# Patient Record
Sex: Female | Born: 1970 | Hispanic: Yes | Marital: Married | Smoking: Never smoker
Health system: Southern US, Community
[De-identification: ages and names within clinical notes are randomized; demographics above are authoritative.]

## PROBLEM LIST (undated history)

## (undated) ENCOUNTER — Inpatient Hospital Stay (HOSPITAL_COMMUNITY): Payer: Self-pay

## (undated) DIAGNOSIS — O24419 Gestational diabetes mellitus in pregnancy, unspecified control: Principal | ICD-10-CM

## (undated) HISTORY — DX: Gestational diabetes mellitus in pregnancy, unspecified control: O24.419

---

## 2006-04-16 ENCOUNTER — Other Ambulatory Visit: Admission: RE | Admit: 2006-04-16 | Discharge: 2006-04-16 | Payer: Self-pay | Admitting: Obstetrics and Gynecology

## 2006-04-16 ENCOUNTER — Encounter (INDEPENDENT_AMBULATORY_CARE_PROVIDER_SITE_OTHER): Payer: Self-pay | Admitting: Specialist

## 2006-04-16 ENCOUNTER — Ambulatory Visit: Payer: Self-pay | Admitting: Obstetrics & Gynecology

## 2007-10-09 ENCOUNTER — Emergency Department (HOSPITAL_COMMUNITY): Admission: EM | Admit: 2007-10-09 | Discharge: 2007-10-10 | Payer: Self-pay | Admitting: Emergency Medicine

## 2007-11-08 ENCOUNTER — Emergency Department (HOSPITAL_COMMUNITY): Admission: EM | Admit: 2007-11-08 | Discharge: 2007-11-08 | Payer: Self-pay | Admitting: Emergency Medicine

## 2007-12-24 ENCOUNTER — Emergency Department (HOSPITAL_COMMUNITY): Admission: EM | Admit: 2007-12-24 | Discharge: 2007-12-25 | Payer: Self-pay | Admitting: Emergency Medicine

## 2008-11-10 ENCOUNTER — Emergency Department (HOSPITAL_COMMUNITY): Admission: EM | Admit: 2008-11-10 | Discharge: 2008-11-11 | Payer: Self-pay | Admitting: Emergency Medicine

## 2010-02-06 ENCOUNTER — Inpatient Hospital Stay (HOSPITAL_COMMUNITY)
Admission: RE | Admit: 2010-02-06 | Discharge: 2010-02-08 | Payer: Self-pay | Source: Home / Self Care | Admitting: Obstetrics

## 2010-03-01 ENCOUNTER — Emergency Department (HOSPITAL_COMMUNITY): Admission: EM | Admit: 2010-03-01 | Discharge: 2009-12-01 | Payer: Self-pay | Admitting: Emergency Medicine

## 2010-06-05 LAB — CBC
HCT: 29.9 % — ABNORMAL LOW (ref 36.0–46.0)
HCT: 33.2 % — ABNORMAL LOW (ref 36.0–46.0)
MCH: 33.8 pg (ref 26.0–34.0)
MCH: 34.7 pg — ABNORMAL HIGH (ref 26.0–34.0)
MCV: 100.5 fL — ABNORMAL HIGH (ref 78.0–100.0)
MCV: 100.8 fL — ABNORMAL HIGH (ref 78.0–100.0)
Platelets: 202 10*3/uL (ref 150–400)
Platelets: 220 10*3/uL (ref 150–400)
RBC: 2.98 MIL/uL — ABNORMAL LOW (ref 3.87–5.11)
RBC: 3.3 MIL/uL — ABNORMAL LOW (ref 3.87–5.11)
RDW: 14 % (ref 11.5–15.5)

## 2010-06-07 LAB — URINALYSIS, ROUTINE W REFLEX MICROSCOPIC
Bilirubin Urine: NEGATIVE
Glucose, UA: NEGATIVE mg/dL
Hgb urine dipstick: NEGATIVE
Ketones, ur: NEGATIVE mg/dL
Nitrite: NEGATIVE
Specific Gravity, Urine: 1.031 — ABNORMAL HIGH (ref 1.005–1.030)
pH: 5.5 (ref 5.0–8.0)

## 2010-06-07 LAB — URINE CULTURE: Culture  Setup Time: 201109090836

## 2010-06-07 LAB — URINE MICROSCOPIC-ADD ON

## 2010-06-30 LAB — POCT I-STAT, CHEM 8
Calcium, Ion: 1.17 mmol/L (ref 1.12–1.32)
Creatinine, Ser: 0.7 mg/dL (ref 0.4–1.2)
Glucose, Bld: 87 mg/dL (ref 70–99)
HCT: 36 % (ref 36.0–46.0)
Hemoglobin: 12.2 g/dL (ref 12.0–15.0)
Potassium: 3.8 mEq/L (ref 3.5–5.1)

## 2010-06-30 LAB — URINE MICROSCOPIC-ADD ON

## 2010-06-30 LAB — WET PREP, GENITAL: Trich, Wet Prep: NONE SEEN

## 2010-06-30 LAB — CBC
HCT: 35.1 % — ABNORMAL LOW (ref 36.0–46.0)
Hemoglobin: 12.3 g/dL (ref 12.0–15.0)
MCHC: 34.9 g/dL (ref 30.0–36.0)
MCV: 99.6 fL (ref 78.0–100.0)
RBC: 3.53 MIL/uL — ABNORMAL LOW (ref 3.87–5.11)
RDW: 13.7 % (ref 11.5–15.5)

## 2010-06-30 LAB — GC/CHLAMYDIA PROBE AMP, GENITAL
Chlamydia, DNA Probe: NEGATIVE
GC Probe Amp, Genital: NEGATIVE

## 2010-06-30 LAB — URINE CULTURE: Colony Count: >=100000

## 2010-06-30 LAB — URINALYSIS, ROUTINE W REFLEX MICROSCOPIC
Bilirubin Urine: NEGATIVE
Glucose, UA: NEGATIVE mg/dL
Hgb urine dipstick: NEGATIVE
Protein, ur: NEGATIVE mg/dL
Urobilinogen, UA: 2 mg/dL — ABNORMAL HIGH (ref 0.0–1.0)

## 2010-07-03 ENCOUNTER — Emergency Department (HOSPITAL_COMMUNITY)
Admission: EM | Admit: 2010-07-03 | Discharge: 2010-07-04 | Disposition: A | Discharge status: Home / Self Care | Payer: BC Managed Care – HMO | Attending: Emergency Medicine | Admitting: Emergency Medicine

## 2010-07-03 DIAGNOSIS — IMO0001 Reserved for inherently not codable concepts without codable children: Secondary | ICD-10-CM | POA: Insufficient documentation

## 2010-07-03 DIAGNOSIS — R197 Diarrhea, unspecified: Secondary | ICD-10-CM | POA: Insufficient documentation

## 2010-07-03 DIAGNOSIS — N390 Urinary tract infection, site not specified: Secondary | ICD-10-CM | POA: Insufficient documentation

## 2010-07-03 DIAGNOSIS — R300 Dysuria: Secondary | ICD-10-CM | POA: Insufficient documentation

## 2010-07-03 DIAGNOSIS — K8020 Calculus of gallbladder without cholecystitis without obstruction: Secondary | ICD-10-CM | POA: Insufficient documentation

## 2010-07-03 DIAGNOSIS — R109 Unspecified abdominal pain: Secondary | ICD-10-CM | POA: Insufficient documentation

## 2010-07-03 DIAGNOSIS — R51 Headache: Secondary | ICD-10-CM | POA: Insufficient documentation

## 2010-07-03 DIAGNOSIS — R112 Nausea with vomiting, unspecified: Secondary | ICD-10-CM | POA: Insufficient documentation

## 2010-07-03 LAB — URINALYSIS, ROUTINE W REFLEX MICROSCOPIC
Glucose, UA: NEGATIVE mg/dL
Hgb urine dipstick: NEGATIVE
Ketones, ur: NEGATIVE mg/dL
Protein, ur: NEGATIVE mg/dL
Urobilinogen, UA: 0.2 mg/dL (ref 0.0–1.0)

## 2010-07-03 LAB — URINE MICROSCOPIC-ADD ON

## 2010-07-04 ENCOUNTER — Emergency Department (HOSPITAL_COMMUNITY): Payer: BC Managed Care – HMO

## 2010-07-04 LAB — POCT I-STAT, CHEM 8
BUN: 11 mg/dL (ref 6–23)
Chloride: 109 mEq/L (ref 96–112)
Creatinine, Ser: 0.7 mg/dL (ref 0.4–1.2)
Potassium: 4 mEq/L (ref 3.5–5.1)
Sodium: 139 mEq/L (ref 135–145)
TCO2: 18 mmol/L (ref 0–100)

## 2010-07-04 LAB — DIFFERENTIAL
Eosinophils Relative: 2 % (ref 0–5)
Lymphocytes Relative: 19 % (ref 12–46)
Lymphs Abs: 1.6 10*3/uL (ref 0.7–4.0)
Monocytes Relative: 3 % (ref 3–12)

## 2010-07-04 LAB — LIPASE, BLOOD: Lipase: 27 U/L (ref 11–59)

## 2010-07-04 LAB — HEPATIC FUNCTION PANEL
ALT: 30 U/L (ref 0–35)
AST: 28 U/L (ref 0–37)
Albumin: 3.8 g/dL (ref 3.5–5.2)
Alkaline Phosphatase: 89 U/L (ref 39–117)
Total Protein: 7.5 g/dL (ref 6.0–8.3)

## 2010-07-04 LAB — CBC
MCH: 33.5 pg (ref 26.0–34.0)
MCHC: 35.1 g/dL (ref 30.0–36.0)
MCV: 95.4 fL (ref 78.0–100.0)
Platelets: 262 10*3/uL (ref 150–400)

## 2010-07-05 LAB — URINE CULTURE: Colony Count: 9000

## 2010-12-25 LAB — URINE MICROSCOPIC-ADD ON

## 2010-12-25 LAB — URINALYSIS, ROUTINE W REFLEX MICROSCOPIC
Glucose, UA: NEGATIVE
Ketones, ur: NEGATIVE
Nitrite: POSITIVE — AB
Protein, ur: 30 — AB
pH: 6

## 2010-12-25 LAB — POCT PREGNANCY, URINE: Preg Test, Ur: POSITIVE

## 2010-12-25 LAB — WET PREP, GENITAL: Clue Cells Wet Prep HPF POC: NONE SEEN

## 2010-12-25 LAB — GC/CHLAMYDIA PROBE AMP, GENITAL: Chlamydia, DNA Probe: NEGATIVE

## 2011-06-27 ENCOUNTER — Other Ambulatory Visit: Payer: Self-pay

## 2011-06-27 DIAGNOSIS — Z331 Pregnant state, incidental: Secondary | ICD-10-CM

## 2011-06-27 NOTE — Progress Notes (Signed)
Prenatal labs done today Oden Lindaman 

## 2011-06-28 LAB — OBSTETRIC PANEL
Basophils Absolute: 0 10*3/uL (ref 0.0–0.1)
Hepatitis B Surface Ag: NEGATIVE
Lymphocytes Relative: 27 % (ref 12–46)
Lymphs Abs: 2.3 10*3/uL (ref 0.7–4.0)
Neutro Abs: 5.5 10*3/uL (ref 1.7–7.7)
Neutrophils Relative %: 67 % (ref 43–77)
Platelets: 299 10*3/uL (ref 150–400)
RBC: 3.76 MIL/uL — ABNORMAL LOW (ref 3.87–5.11)
RDW: 13.6 % (ref 11.5–15.5)
Rubella: 26.5 IU/mL — ABNORMAL HIGH
WBC: 8.3 10*3/uL (ref 4.0–10.5)

## 2011-06-28 LAB — HIV ANTIBODY (ROUTINE TESTING W REFLEX): HIV: NONREACTIVE

## 2011-06-30 LAB — CULTURE, OB URINE: Colony Count: 35000

## 2011-07-04 ENCOUNTER — Ambulatory Visit (INDEPENDENT_AMBULATORY_CARE_PROVIDER_SITE_OTHER): Payer: Self-pay | Admitting: Family Medicine

## 2011-07-04 ENCOUNTER — Encounter: Payer: Self-pay | Admitting: Family Medicine

## 2011-07-04 DIAGNOSIS — Z3480 Encounter for supervision of other normal pregnancy, unspecified trimester: Secondary | ICD-10-CM

## 2011-07-04 DIAGNOSIS — R8271 Bacteriuria: Secondary | ICD-10-CM | POA: Insufficient documentation

## 2011-07-04 DIAGNOSIS — E669 Obesity, unspecified: Secondary | ICD-10-CM

## 2011-07-04 DIAGNOSIS — O99891 Other specified diseases and conditions complicating pregnancy: Secondary | ICD-10-CM

## 2011-07-04 MED ORDER — NITROFURANTOIN MONOHYD MACRO 100 MG PO CAPS: 100.0000 mg | ORAL_CAPSULE | Freq: Two times a day (BID) | ORAL | Status: AC

## 2011-07-04 MED ORDER — PRENATAL VITAMINS (DIS) PO TABS: 1.0000 | ORAL_TABLET | Freq: Every day | ORAL | Status: DC

## 2011-07-04 NOTE — Progress Notes (Signed)
  Subjective:    Christie Holder is a G2P0010 [redacted]w[redacted]d being seen today for her first obstetrical visit.  Her obstetrical history is significant for advanced maternal age, obesity and 4 week miscarriage in 2009.Marland Kitchen Patient does intend to breast feed. Pregnancy history fully reviewed.  Patient reports Fatigue, but nausea has improved.Ceasar Mons Vitals:   07/04/11 1429 07/04/11 1429  BP: 122/66   Temp: 98.5 F (36.9 C)   Height:  5' 2.25" (1.581 m)  Weight: 236 lb (107.049 kg)     HISTORY: OB History    Grav Para Term Preterm Abortions TAB SAB Ect Mult Living   2    1  1         # Outc Date GA Lbr Len/2nd Wgt Sex Del Anes PTL Lv   1 SAB            2 CUR              History reviewed. No pertinent past medical history. History reviewed. No pertinent past surgical history. Family History  Problem Relation Age of Onset  . Diabetes Maternal Grandmother      Exam    Uterus:   Gravid, fundal height 15 cm  Pelvic Exam:    Perineum: No Hemorrhoids   Vulva: normal   Vagina:  normal mucosa, normal discharge       Cervix: Appears normal   Adnexa: normal adnexa      System:         Neurologic: oriented, normal mood   Extremities: No edema   HEENT PERRLA and extra ocular movement intact   Mouth/Teeth mucous membranes moist, pharynx normal without lesions   Neck supple   Cardiovascular: regular rate and rhythm   Respiratory:  appears well, vitals normal, no respiratory distress, acyanotic, normal RR, ear and throat exam is normal, neck free of mass or lymphadenopathy, chest clear, no wheezing, crepitations, rhonchi, normal symmetric air entry   Abdomen: soft, non-tender; bowel sounds normal; no masses,  no organomegaly          Assessment:    Pregnancy: G2P0010 Patient Active Problem List  Diagnoses  . Asymptomatic bacteriuria in pregnancy  . Pregnancy examination or test        Plan:     Initial labs reviewed. Patient with 35,000 colonies of E. Coli, will  treat with Macrobid for 7 days for asymptomatic bacteruria. Repeat urine culture in one week.  Patient desires Quad screen, will have her come in in one week to have it drawn. Will also have patient do early glucola in one week due to BMI >30  Prenatal vitamins. Problem list reviewed and updated.  Ultrasound discussed; Patient is applying for orange card, will order anatomy scan next visit.  Bleeding precautions.   Follow up in 4 weeks.    Lynnda Wiersma 07/04/2011

## 2011-07-04 NOTE — Patient Instructions (Signed)
Please come in for a lab visit in one week.  We will draw the genetics test, test your blood sugar, and repeat your urine test to make sure the bacteria is cone.   Please come back in about 4 weeks for your next check up.

## 2011-07-15 ENCOUNTER — Other Ambulatory Visit: Payer: Self-pay

## 2011-07-15 DIAGNOSIS — E669 Obesity, unspecified: Secondary | ICD-10-CM

## 2011-07-15 LAB — GLUCOSE, CAPILLARY
Comment 1: 1
Glucose-Capillary: 133 mg/dL — ABNORMAL HIGH (ref 70–99)

## 2011-07-15 NOTE — Progress Notes (Signed)
Note reviewed.  Agree with Dr. Melina Modena plan.   AMA- pt desires quad screen.  Pt also could meet with genetics if interested in discussing risk and testing options, such as amnio. Obesity- pt here today to get early 1 hour glucola.  Will also need rescreening at 24-28 weeks if passes today's test.

## 2011-07-15 NOTE — Progress Notes (Unsigned)
1 hr Glucose = 133 mg/dL Franne Forts screen [place], MLS

## 2011-07-17 LAB — CULTURE, OB URINE: Colony Count: 6000

## 2011-08-08 ENCOUNTER — Ambulatory Visit (INDEPENDENT_AMBULATORY_CARE_PROVIDER_SITE_OTHER): Payer: Self-pay | Admitting: Family Medicine

## 2011-08-08 VITALS — BP 100/62 | Temp 97.8°F | Wt 235.0 lb

## 2011-08-08 DIAGNOSIS — O09529 Supervision of elderly multigravida, unspecified trimester: Secondary | ICD-10-CM

## 2011-08-08 DIAGNOSIS — Z3400 Encounter for supervision of normal first pregnancy, unspecified trimester: Secondary | ICD-10-CM

## 2011-08-08 DIAGNOSIS — Z3490 Encounter for supervision of normal pregnancy, unspecified, unspecified trimester: Secondary | ICD-10-CM

## 2011-08-08 DIAGNOSIS — Z3402 Encounter for supervision of normal first pregnancy, second trimester: Secondary | ICD-10-CM

## 2011-08-08 NOTE — Patient Instructions (Signed)
It was nice to see you today. I am sorry you have a bad cold.  You should feel better in a few days.  Try to drink plenty of fluids, especially water, and rest when you can.  You can try some plain Robitussin. (Robitussin DM), but it won't help much.   If you have bad pain or any vaginal bleeding, please let us know. We will set up an ultrasound with Dr. Elsie Stain office. Please come back and see [place] in 2 weeks (Dr. Ashley Royalty) Fue agradable verte hoy. Lo Siento que tiene un resfriado. Usted debe sentirse mejor en unos [employer]. Trate de beber muchos lquidos, especialmente agua, y el resto cuando pueda. Puede probar algunos Robitussin normal. (Robitussin DM), pero no va a ayudar mucho.Si usted tiene [facility]bal[facility] severo o el sangrado vaginal, por favor hganoslo saber.Vamos a establecer una ecografa con la oficina del Dr. Gaynell Face.Por favor, vuelva a vernos en 2 semanas (Dr. Ashley Royalty)

## 2011-08-08 NOTE — Progress Notes (Signed)
Has a cold.  Told at urgent care that she had the flu and was put on amoxicillin and decongestant.  + cough.  No fever currently. Takes tylenol, meds from urgent care. PE: Obese.  No distress. OP clear. Lungs clear. See flow sheet for details. A/P: Pregnancy - going well.  Will schedule ultrasound with Dr. Elsie Stain office. Obesity: Early 1 hour 133.  Pt has lost 1 lb due to current URI.  Will need routine glucola 26-28 weeks. AMA: normal quad screen. Asymptomatic Bacteruria - TOC negative.

## 2011-08-12 ENCOUNTER — Ambulatory Visit: Payer: Self-pay | Admitting: Family Medicine

## 2011-08-13 ENCOUNTER — Telehealth: Payer: Self-pay | Admitting: Family Medicine

## 2011-08-13 NOTE — Telephone Encounter (Signed)
I called pt and LVM hopelly pt will call us back.  Marines

## 2011-08-22 ENCOUNTER — Encounter: Payer: Self-pay | Admitting: Family Medicine

## 2011-09-02 ENCOUNTER — Encounter: Payer: Self-pay | Admitting: Family Medicine

## 2011-09-10 ENCOUNTER — Ambulatory Visit (INDEPENDENT_AMBULATORY_CARE_PROVIDER_SITE_OTHER): Payer: Self-pay | Admitting: Family Medicine

## 2011-09-10 DIAGNOSIS — Z3400 Encounter for supervision of normal first pregnancy, unspecified trimester: Secondary | ICD-10-CM

## 2011-09-10 NOTE — Progress Notes (Signed)
S: Here today for routine prenatal follow-up. Did not get anatomy ultrasound due to financial limitations.  Plans to get it in the next several weeks.  Does not desire amniocentesis.  Has gained a large amount of weight due to increased appetite.  Declines 1 hr glucola today. A/P:  41 yo G7P6 @ 24 + 5 weeks.  Rescheduled anatomy ultrasound for her.  She declines 1 hr glucola today.  WIll see her back in 2 weeks for follow-up and to obtain 26-28 wk labs.

## 2011-09-10 NOTE — Patient Instructions (Addendum)
Come back in 2 weeks to see Dr Louanne Belton or [facility]  Will get your 1 hr glucola test at that time  Get ultrasound at Dr. Elsie Stain office.  Work on [employer], weight gain

## 2011-09-30 ENCOUNTER — Ambulatory Visit (INDEPENDENT_AMBULATORY_CARE_PROVIDER_SITE_OTHER): Payer: Self-pay | Admitting: Family Medicine

## 2011-09-30 VITALS — BP 115/74 | Temp 98.4°F | Wt 252.0 lb

## 2011-09-30 DIAGNOSIS — Z3480 Encounter for supervision of other normal pregnancy, unspecified trimester: Secondary | ICD-10-CM

## 2011-09-30 DIAGNOSIS — O0990 Supervision of high risk pregnancy, unspecified, unspecified trimester: Secondary | ICD-10-CM | POA: Insufficient documentation

## 2011-09-30 DIAGNOSIS — Z3490 Encounter for supervision of normal pregnancy, unspecified, unspecified trimester: Secondary | ICD-10-CM

## 2011-09-30 LAB — HIV ANTIBODY (ROUTINE TESTING W REFLEX): HIV: NONREACTIVE

## 2011-09-30 LAB — CBC
HCT: 34.6 % — ABNORMAL LOW (ref 36.0–46.0)
Hemoglobin: 11.7 g/dL — ABNORMAL LOW (ref 12.0–15.0)
MCH: 32.8 pg (ref 26.0–34.0)
RBC: 3.57 MIL/uL — ABNORMAL LOW (ref 3.87–5.11)

## 2011-09-30 NOTE — Patient Instructions (Signed)
Please be make an appointment at the front desk to have your blood sugar test done.  This will take one hour, and needs to be done this week.   Please make an appointment to see Dr. Louanne Belton in two weeks.    Tenga en hacer una cita en la recepcin para tener su prueba de azcar en la sangre hace. Esto tomar [employer], y que hay que hacer esta semana.  Por favor, haga una cita para ver al doctor [employer].

## 2011-09-30 NOTE — Progress Notes (Signed)
41 year old [id number] @ [redacted]w[redacted]d who presents for prenatal visit.  She says she is feeling the baby move, denies contractions, vaginal bleeding, vaginal discharge.  She has an appointment for her ultrasound at Dr. Elsie Stain office on Friday.  The patient has refused an early Glucola, and today is due for one, but she says she does not have time today.   - Will draw CBC, HIV, and RPR today.  - Patient instructed to schedule lab visit for Glucola, stressed importance of this being done this week.  - Labor Precautions reviewed.  - Follow up with Dr. Louanne Belton in 2 weeks.

## 2011-10-02 ENCOUNTER — Encounter: Payer: Self-pay | Admitting: Family Medicine

## 2011-10-03 ENCOUNTER — Other Ambulatory Visit: Payer: Self-pay

## 2011-10-03 LAB — GLUCOSE, CAPILLARY
Comment 1: 1
Glucose-Capillary: 141 mg/dL — ABNORMAL HIGH (ref 70–99)

## 2011-10-03 NOTE — Progress Notes (Signed)
1 hr done today, 3 hr scheduled for 10/09/11.Busick, Rodena Medin

## 2011-10-09 ENCOUNTER — Other Ambulatory Visit: Payer: Self-pay

## 2011-10-09 DIAGNOSIS — Z3490 Encounter for supervision of normal pregnancy, unspecified, unspecified trimester: Secondary | ICD-10-CM

## 2011-10-09 LAB — GLUCOSE, CAPILLARY

## 2011-10-09 NOTE — Progress Notes (Signed)
3 HR GTT DONE TODAY Christie Holder 

## 2011-10-10 ENCOUNTER — Encounter: Payer: Self-pay | Admitting: Family Medicine

## 2011-10-10 ENCOUNTER — Telehealth: Payer: Self-pay | Admitting: Family Medicine

## 2011-10-10 DIAGNOSIS — O24419 Gestational diabetes mellitus in pregnancy, unspecified control: Secondary | ICD-10-CM | POA: Insufficient documentation

## 2011-10-10 HISTORY — DX: Gestational diabetes mellitus in pregnancy, unspecified control: O24.419

## 2011-10-10 LAB — GLUCOSE TOLERANCE, 3 HOURS
Glucose Tolerance, 1 hour: 186 mg/dL (ref 70–189)
Glucose Tolerance, 2 hour: 162 mg/dL (ref 70–164)
Glucose Tolerance, Fasting: 104 mg/dL (ref 70–104)
Glucose, GTT - 3 Hour: 84 mg/dL (ref 70–144)

## 2011-10-10 NOTE — Telephone Encounter (Addendum)
Patient has failed 3 hour glucola.  Will call her later today.  Ref to [facility] for management.

## 2011-10-14 NOTE — Telephone Encounter (Signed)
Tried to call.  Number we have for patient is incorrect.  If patient calls back, please update number.

## 2011-10-16 ENCOUNTER — Ambulatory Visit (INDEPENDENT_AMBULATORY_CARE_PROVIDER_SITE_OTHER): Payer: Self-pay | Admitting: Family Medicine

## 2011-10-16 VITALS — BP 117/62 | Temp 98.1°F | Wt 260.0 lb

## 2011-10-16 DIAGNOSIS — Z3480 Encounter for supervision of other normal pregnancy, unspecified trimester: Secondary | ICD-10-CM

## 2011-10-16 DIAGNOSIS — O99810 Abnormal glucose complicating pregnancy: Secondary | ICD-10-CM

## 2011-10-16 DIAGNOSIS — O24419 Gestational diabetes mellitus in pregnancy, unspecified control: Secondary | ICD-10-CM

## 2011-10-16 DIAGNOSIS — Z3490 Encounter for supervision of normal pregnancy, unspecified, unspecified trimester: Secondary | ICD-10-CM

## 2011-10-16 NOTE — Progress Notes (Signed)
41 year old [id number] @ 29.6 presenting for visit. The patient is doing well with no complaints. She failed her three-hour Glucola. A referral has been made to high risk clinic. She has an appointment scheduled with them next Monday. The patient's ultrasound has been reviewed and did not show any concerning findings. This will be scanned and available in the future. Prenatal labs were reviewed and, other than Glucola, or other concerning. Exam: Patient has a notable amount of pannus making fundal height measurements is somewhat hard. Assessment and plan: I provided the patient with education about a low carbohydrate diet and informed her about her appointment with the high risk clinic next week.

## 2011-10-16 NOTE — Patient Instructions (Signed)
It was good to see you today! Make certain to go to your appointment at [facility] on the 29th of this month.

## 2011-10-17 ENCOUNTER — Encounter: Payer: Self-pay | Admitting: *Deleted

## 2011-10-19 ENCOUNTER — Encounter (HOSPITAL_COMMUNITY): Payer: Self-pay | Admitting: Obstetrics and Gynecology

## 2011-10-19 ENCOUNTER — Inpatient Hospital Stay (HOSPITAL_COMMUNITY)
Admission: AD | Admit: 2011-10-19 | Discharge: 2011-10-23 | DRG: 781 | Disposition: A | Discharge status: Home / Self Care | Payer: Medicaid Other | Source: Ambulatory Visit | Attending: Obstetrics and Gynecology | Admitting: Obstetrics and Gynecology

## 2011-10-19 DIAGNOSIS — R079 Chest pain, unspecified: Secondary | ICD-10-CM | POA: Diagnosis present

## 2011-10-19 DIAGNOSIS — N12 Tubulo-interstitial nephritis, not specified as acute or chronic: Secondary | ICD-10-CM

## 2011-10-19 DIAGNOSIS — O99810 Abnormal glucose complicating pregnancy: Secondary | ICD-10-CM | POA: Diagnosis present

## 2011-10-19 DIAGNOSIS — O2390 Unspecified genitourinary tract infection in pregnancy, unspecified trimester: Principal | ICD-10-CM | POA: Diagnosis present

## 2011-10-19 DIAGNOSIS — Z3490 Encounter for supervision of normal pregnancy, unspecified, unspecified trimester: Secondary | ICD-10-CM

## 2011-10-19 DIAGNOSIS — R509 Fever, unspecified: Secondary | ICD-10-CM

## 2011-10-19 DIAGNOSIS — R109 Unspecified abdominal pain: Secondary | ICD-10-CM

## 2011-10-19 DIAGNOSIS — O2300 Infections of kidney in pregnancy, unspecified trimester: Secondary | ICD-10-CM | POA: Diagnosis present

## 2011-10-19 DIAGNOSIS — R300 Dysuria: Secondary | ICD-10-CM

## 2011-10-19 LAB — CBC
HCT: 32 % — ABNORMAL LOW (ref 36.0–46.0)
Hemoglobin: 10.9 g/dL — ABNORMAL LOW (ref 12.0–15.0)
WBC: 13.3 10*3/uL — ABNORMAL HIGH (ref 4.0–10.5)

## 2011-10-19 LAB — TYPE AND SCREEN
ABO/RH(D): O POS
Antibody Screen: NEGATIVE

## 2011-10-19 LAB — URINALYSIS, ROUTINE W REFLEX MICROSCOPIC
Bilirubin Urine: NEGATIVE
Glucose, UA: NEGATIVE mg/dL
Ketones, ur: NEGATIVE mg/dL
pH: 6.5 (ref 5.0–8.0)

## 2011-10-19 LAB — URINE MICROSCOPIC-ADD ON

## 2011-10-19 LAB — DIFFERENTIAL
Basophils Relative: 0 % (ref 0–1)
Eosinophils Absolute: 0 10*3/uL (ref 0.0–0.7)
Eosinophils Relative: 0 % (ref 0–5)
Monocytes Relative: 2 % — ABNORMAL LOW (ref 3–12)
Neutrophils Relative %: 96 % — ABNORMAL HIGH (ref 43–77)

## 2011-10-19 MED ORDER — ACETAMINOPHEN 325 MG PO TABS: 650.0000 mg | ORAL_TABLET | Freq: Four times a day (QID) | ORAL | Status: DC | PRN

## 2011-10-19 MED ORDER — DEXTROSE 5 % IV SOLN
1.0000 g | Freq: Two times a day (BID) | INTRAVENOUS | Status: DC
  Administered 2011-10-19 – 2011-10-22 (×8): 1 g via INTRAVENOUS
  Filled 2011-10-19 (×9): qty 10

## 2011-10-19 MED ORDER — FENTANYL CITRATE 0.05 MG/ML IJ SOLN
100.0000 ug | INTRAMUSCULAR | Status: DC | PRN
  Administered 2011-10-20 – 2011-10-21 (×4): 100 ug via INTRAVENOUS
  Filled 2011-10-19 (×5): qty 2

## 2011-10-19 MED ORDER — ACETAMINOPHEN 500 MG PO TABS
1000.0000 mg | ORAL_TABLET | Freq: Once | ORAL | Status: AC
  Administered 2011-10-19: 1000 mg via ORAL
  Filled 2011-10-19: qty 2

## 2011-10-19 MED ORDER — OXYCODONE HCL 5 MG PO TABS
5.0000 mg | ORAL_TABLET | ORAL | Status: DC | PRN
  Administered 2011-10-19 – 2011-10-23 (×8): 5 mg via ORAL
  Filled 2011-10-19 (×8): qty 1

## 2011-10-19 MED ORDER — CALCIUM CARBONATE ANTACID 500 MG PO CHEW: 2.0000 | CHEWABLE_TABLET | ORAL | Status: DC | PRN

## 2011-10-19 MED ORDER — PRENATAL MULTIVITAMIN CH
1.0000 | ORAL_TABLET | Freq: Every day | ORAL | Status: DC
  Administered 2011-10-21 – 2011-10-23 (×3): 1 via ORAL
  Filled 2011-10-19 (×4): qty 1

## 2011-10-19 MED ORDER — SODIUM CHLORIDE 0.9 % IV SOLN
INTRAVENOUS | Status: DC
  Administered 2011-10-19: 125 mL/h via INTRAVENOUS
  Administered 2011-10-20 – 2011-10-23 (×9): via INTRAVENOUS

## 2011-10-19 MED ORDER — ZOLPIDEM TARTRATE 5 MG PO TABS
5.0000 mg | ORAL_TABLET | Freq: Every evening | ORAL | Status: DC | PRN
  Administered 2011-10-20 – 2011-10-21 (×2): 5 mg via ORAL
  Filled 2011-10-19 (×3): qty 1

## 2011-10-19 MED ORDER — DOCUSATE SODIUM 100 MG PO CAPS
100.0000 mg | ORAL_CAPSULE | Freq: Every day | ORAL | Status: DC
  Administered 2011-10-21 – 2011-10-23 (×3): 100 mg via ORAL
  Filled 2011-10-19 (×4): qty 1

## 2011-10-19 NOTE — MAU Note (Signed)
Onset of chills and chest discomfort with dysuria since 0400 today, patient is 31 weeks, having left flank pain and lower abdominal pain, headache, denies cough, no congestion.

## 2011-10-19 NOTE — H&P (Signed)
HPI Patient is a 41 yo G8P6016 at 46.2 EGA who presents with chest pain, dysuria, and generalized aches.  States starting this morning at 4 am she had whole body aches, started shaking, and felt cold.  She states her legs hurt, the back of her head hurts, and she started sweating at that time.  She endorses dysuria and back pain that started around the same time.  She denies any rhinorrhea, cough, or congestion.  She also states she has had some chest pain since this morning as well.  She states she has a sour taste in the back of her mouth, her mouth is dry, and she has dry heaved with some nausea.  She also states that her chest feels tight like when she has a cold.  She took tylenol this morning and that helped some with her pain and chills.  She denies any discharge or vaginal bleeding.  She denies any sick contacts.  She was previously found to have a UTI that grew E. Coli in May.  While in the MAU she was found to have a fever to 102.4 rectally and continued to have chills.  Her white count was elevated to 13.3 and her relative neutrophil count was 96.  She had trace leukocytes in her urine with no nitrites.  OB History   Grav Para Term Preterm Abortions TAB SAB Ect Mult Living  8 6 6  1  1   6     Past Medical History Diagnosis Date . Gestational diabetes 10/10/2011   Past Surgical History Procedure Date . No past surgeries    Family History Problem Relation Age of Onset . Diabetes Maternal Grandmother    History Substance Use Topics . Smoking status: Never Smoker  . Smokeless tobacco: Not on file . Alcohol Use: No   Allergies: No Known Allergies  Prescriptions prior to admission Medication Sig Dispense Refill . Prenatal Vitamins (DIS) TABS Take 1 tablet by mouth daily.  30 tablet  11   ROS negative except per HPI Physical Exam  Blood pressure 119/64, pulse 119, temperature 98.2 F (36.8 C), temperature source Oral, resp. rate 18, height 5\' 3"  (1.6 m), weight  117.845 kg (259 lb 12.8 oz), last menstrual period 03/21/2011, unknown if currently breastfeeding.  Filed Vitals:   10/19/11 1325 10/19/11 1500 10/19/11 1525 10/19/11 1530  BP: 119/64     Pulse: 119     Temp: 98.2 F (36.8 C) 100.6 F (38.1 C) 103 F (39.4 C) 102.4 F (39.1 C)  TempSrc: Oral Oral Axillary Rectal  Resp: 18     Height: 5\' 3"  (1.6 m)     Weight: 117.845 kg (259 lb 12.8 oz)       Physical Exam  Constitutional: She is oriented to person, place, and time. She appears well-developed and well-nourished.  HENT:  Head: Normocephalic and atraumatic.  Neck: Normal range of motion.       No tenderness to palpation of cervical spinous processes  Cardiovascular: Normal rate, regular rhythm and normal heart sounds.   Respiratory: Effort normal and breath sounds normal. She exhibits tenderness (over costochondral joints).  GI: Soft. Bowel sounds are normal. There is no tenderness.       No CVA tenderness  Musculoskeletal: She exhibits tenderness (bilateral lower extremity and bilateral upper extremity). She exhibits no edema.  Neurological: She is alert and oriented to person, place, and time.  Psychiatric: She has a normal mood and affect. Her behavior is normal.   Results for  orders placed during the hospital encounter of 10/19/11 (from the past 24 hour(s))  URINALYSIS, ROUTINE W REFLEX MICROSCOPIC     Status: Abnormal   Collection Time   10/19/11  1:30 PM      Component Value Range   Color, Urine YELLOW  YELLOW   APPearance CLEAR  CLEAR   Specific Gravity, Urine 1.010  1.005 - 1.030   pH 6.5  5.0 - 8.0   Glucose, UA NEGATIVE  NEGATIVE mg/dL   Hgb urine dipstick NEGATIVE  NEGATIVE   Bilirubin Urine NEGATIVE  NEGATIVE   Ketones, ur NEGATIVE  NEGATIVE mg/dL   Protein, ur NEGATIVE  NEGATIVE mg/dL   Urobilinogen, UA 0.2  0.0 - 1.0 mg/dL   Nitrite NEGATIVE  NEGATIVE   Leukocytes, UA TRACE (*) NEGATIVE  URINE MICROSCOPIC-ADD ON     Status: Abnormal   Collection Time    10/19/11  1:30 PM      Component Value Range   WBC, UA 7-10  <3 WBC/hpf   RBC / HPF 0-2  <3 RBC/hpf   Bacteria, UA FEW (*) RARE  CBC     Status: Abnormal   Collection Time   10/19/11  2:44 PM      Component Value Range   WBC 13.3 (*) 4.0 - 10.5 K/uL   RBC 3.29 (*) 3.87 - 5.11 MIL/uL   Hemoglobin 10.9 (*) 12.0 - 15.0 g/dL   HCT 16.1 (*) 09.6 - 04.5 %   MCV 97.3  78.0 - 100.0 fL   MCH 33.1  26.0 - 34.0 pg   MCHC 34.1  30.0 - 36.0 g/dL   RDW 40.9  81.1 - 91.4 %   Platelets 238  150 - 400 K/uL  DIFFERENTIAL     Status: Abnormal   Collection Time   10/19/11  2:44 PM      Component Value Range   Neutrophils Relative 96 (*) 43 - 77 %   Neutro Abs 12.5 (*) 1.7 - 7.7 K/uL   Lymphocytes Relative 3 (*) 12 - 46 %   Lymphs Abs 0.4 (*) 0.7 - 4.0 K/uL   Monocytes Relative 2 (*) 3 - 12 %   Monocytes Absolute 0.2  0.1 - 1.0 K/uL   Eosinophils Relative 0  0 - 5 %   Eosinophils Absolute 0.0  0.0 - 0.7 K/uL   Basophils Relative 0  0 - 1 %   Basophils Absolute 0.0  0.0 - 0.1 K/uL    Assessment and Plan Patient is a [id number] at 30.2 EGA who presents with a variety of complaints.  Fever: likely bacterial in origin given relative neutrophil count.  Source difficult to determine given normal pulmonary and cardiovascular exam, as well as lack of CVA tenderness.  Given complaint of dysuria and back pain, possibly is a result of UTI or pyelonephritis.  Will admit the patient and obtain urine culture and blood culture. Rocephin to be started after collection of blood cultures.  Chest pain: given reproducible nature of chest pain, normal blood pressure, likely due to costochondritis.  Also could be a component of reflux given sour taste in back of mouth with some dry heaving. Fentanyl for pain.  Admit to antenatal floor.     I have seen/examined this patient and agree with the above. Kevaughn Ewing E.

## 2011-10-19 NOTE — MAU Note (Signed)
"  I have a different smell to my pee pee and it is painful and it burns."

## 2011-10-19 NOTE — MAU Note (Signed)
"   I have had chest pain, chills for about one hour, so I took Tylenol.  I last took Tylenol at 0900 this morning."

## 2011-10-20 LAB — CBC
HCT: 29.7 % — ABNORMAL LOW (ref 36.0–46.0)
Hemoglobin: 10.2 g/dL — ABNORMAL LOW (ref 12.0–15.0)
MCHC: 34.3 g/dL (ref 30.0–36.0)
MCV: 96.4 fL (ref 78.0–100.0)

## 2011-10-20 LAB — GLUCOSE, CAPILLARY: Glucose-Capillary: 120 mg/dL — ABNORMAL HIGH (ref 70–99)

## 2011-10-20 MED ORDER — ACETAMINOPHEN 500 MG PO TABS
1000.0000 mg | ORAL_TABLET | Freq: Four times a day (QID) | ORAL | Status: DC | PRN
  Administered 2011-10-20 (×3): 1000 mg via ORAL
  Filled 2011-10-20 (×3): qty 2

## 2011-10-20 NOTE — Progress Notes (Signed)
Patient ID: Christie Holder, female   DOB: 08-04-1970, 41 y.o.   MRN: [id number] FACULTY PRACTICE ANTEPARTUM(COMPREHENSIVE) NOTE  Christie Holder is a 41 y.o. [id number] at [redacted]w[redacted]d who is admitted for presumed pyelonephritis.   Length of Stay:  1  Days  Subjective: Patient reports feeling better, but reports persistent back pain Right greater than Left. Patient reports the fetal movement as active. Patient reports uterine contraction  activity as none. Patient reports  vaginal bleeding as none. Patient describes fluid per vagina as None.  Vitals:  Blood pressure 98/41, pulse 110, temperature 98.7 F (37.1 C), temperature source Oral, resp. rate 20, height 5\' 3"  (1.6 m), weight 117.845 kg (259 lb 12.8 oz), last menstrual period 03/21/2011, unknown if currently breastfeeding. Physical Examination:  General appearance - alert, well appearing, and in no distress Fundal Height:  size equals dates Pelvic Exam:  examination not indicated Cervical Exam: Not evaluated.  Extremities: Homans sign is negative, no sign of DVT with DTRs 2+ bilaterally Membranes:intact  Fetal Monitoring:  Baseline: 170 bpm, Variability: Good {> 6 bpm), Accelerations: Non-reactive but appropriate for gestational age and Toco: no contractrions  Labs:  Recent Results (from the past 24 hour(s))  URINALYSIS, ROUTINE W REFLEX MICROSCOPIC   Collection Time   10/19/11  1:30 PM      Component Value Range   Color, Urine YELLOW  YELLOW   APPearance CLEAR  CLEAR   Specific Gravity, Urine 1.010  1.005 - 1.030   pH 6.5  5.0 - 8.0   Glucose, UA NEGATIVE  NEGATIVE mg/dL   Hgb urine dipstick NEGATIVE  NEGATIVE   Bilirubin Urine NEGATIVE  NEGATIVE   Ketones, ur NEGATIVE  NEGATIVE mg/dL   Protein, ur NEGATIVE  NEGATIVE mg/dL   Urobilinogen, UA 0.2  0.0 - 1.0 mg/dL   Nitrite NEGATIVE  NEGATIVE   Leukocytes, UA TRACE (*) NEGATIVE  URINE MICROSCOPIC-ADD ON   Collection Time   10/19/11  1:30 PM      Component Value Range    WBC, UA 7-10  <3 WBC/hpf   RBC / HPF 0-2  <3 RBC/hpf   Bacteria, UA FEW (*) RARE  CBC   Collection Time   10/19/11  2:44 PM      Component Value Range   WBC 13.3 (*) 4.0 - 10.5 K/uL   RBC 3.29 (*) 3.87 - 5.11 MIL/uL   Hemoglobin 10.9 (*) 12.0 - 15.0 g/dL   HCT 95.6 (*) 38.7 - 56.4 %   MCV 97.3  78.0 - 100.0 fL   MCH 33.1  26.0 - 34.0 pg   MCHC 34.1  30.0 - 36.0 g/dL   RDW 33.2  95.1 - 88.4 %   Platelets 238  150 - 400 K/uL  DIFFERENTIAL   Collection Time   10/19/11  2:44 PM      Component Value Range   Neutrophils Relative 96 (*) 43 - 77 %   Neutro Abs 12.5 (*) 1.7 - 7.7 K/uL   Lymphocytes Relative 3 (*) 12 - 46 %   Lymphs Abs 0.4 (*) 0.7 - 4.0 K/uL   Monocytes Relative 2 (*) 3 - 12 %   Monocytes Absolute 0.2  0.1 - 1.0 K/uL   Eosinophils Relative 0  0 - 5 %   Eosinophils Absolute 0.0  0.0 - 0.7 K/uL   Basophils Relative 0  0 - 1 %   Basophils Absolute 0.0  0.0 - 0.1 K/uL  TYPE AND SCREEN   Collection  Time   10/19/11  9:30 PM      Component Value Range   ABO/RH(D) O POS     Antibody Screen NEG     Sample Expiration 10/22/2011    GLUCOSE, CAPILLARY   Collection Time   10/20/11  5:57 AM      Component Value Range   Glucose-Capillary 120 (*) 70 - 99 mg/dL    Imaging Studies:    Currently EPIC will not allow sonographic studies to automatically populate into notes.  In the meantime, copy and paste results into note or free text.  Medications:  Scheduled    . acetaminophen  1,000 mg Oral Once  . cefTRIAXone (ROCEPHIN)  IV  1 g Intravenous Q12H  . docusate sodium  100 mg Oral Daily  . prenatal multivitamin  1 tablet Oral Daily     ASSESSMENT: Patient Active Problem List  Diagnosis  . Asymptomatic bacteriuria in pregnancy  . Obesity  . Supervision of normal pregnancy  . Gestational diabetes    PLAN: Patient afebrile for 12 hr Continue IV antibiotics  Continue current care Christie Holder 10/20/2011,7:27 AM

## 2011-10-20 NOTE — Progress Notes (Signed)
1000- Wynelle Bourgeois CNM notified of pt's increasing temp and fetal tachycardia. Orders received for tylenol 1gm po every 6 hours.  1010- Nolen Mu, diabetes coordinator notified that she needs to see pt. States she will see her her tomorrow.

## 2011-10-20 NOTE — H&P (Signed)
Agree with above note.  Christie Holder 10/20/2011 7:45 AM

## 2011-10-21 ENCOUNTER — Encounter: Payer: Self-pay | Admitting: Obstetrics and Gynecology

## 2011-10-21 LAB — GLUCOSE, CAPILLARY
Glucose-Capillary: 166 mg/dL — ABNORMAL HIGH (ref 70–99)
Glucose-Capillary: 104 mg/dL — ABNORMAL HIGH (ref 70–99)
Glucose-Capillary: 136 mg/dL — ABNORMAL HIGH (ref 70–99)

## 2011-10-21 LAB — CBC
HCT: 27.7 % — ABNORMAL LOW (ref 36.0–46.0)
Hemoglobin: 9.4 g/dL — ABNORMAL LOW (ref 12.0–15.0)
MCH: 33.2 pg (ref 26.0–34.0)
RBC: 2.83 MIL/uL — ABNORMAL LOW (ref 3.87–5.11)

## 2011-10-21 MED ORDER — GLYBURIDE 5 MG PO TABS
5.0000 mg | ORAL_TABLET | Freq: Every day | ORAL | Status: DC
  Administered 2011-10-21: 5 mg via ORAL
  Filled 2011-10-21 (×2): qty 1

## 2011-10-21 MED ORDER — GLYBURIDE 5 MG PO TABS: 5.0000 mg | ORAL_TABLET | Freq: Two times a day (BID) | ORAL | Status: DC

## 2011-10-21 NOTE — Progress Notes (Signed)
Pt. Seen and examined this am.  She did have fever to 102.7.  Now that fever is resolving so is her headache.  She also reports that this all started with a strong smelling urine.  She then increased her po intake of water.  Urine culture is growing E.coli at 25 K colony count.  She has a very supple neck at this time and is able to bring chin to chest.  I will hold on spinal tap at this time, and re-examine her later in the afternoon.

## 2011-10-21 NOTE — Progress Notes (Signed)
  Nutrition Dx: Food and nutrition-related knowledge deficit r/t no previous education aeb newly diagnosed GDM.    Nutrition education consult for Carbohydrate Modified Gestational Diabetic Diet completed.  "Meal  plan for gestational diabetics" handout given to patient.  Pt requested copy of education in spanish, which I provided. I offered twice to review diet parameters with the patient. She declined twice, saying that she would read it and a family member would help her if needed. Offered to come back at a later time if she decided she had questions.  Elisabeth Cara M.Odis Luster LDN Neonatal Nutrition Support Specialist Pager [phone]

## 2011-10-21 NOTE — Progress Notes (Signed)
Patient ID: Christie Holder, female   DOB: 1970/09/11, 41 y.o.   MRN: [id number] Please see my other note.

## 2011-10-21 NOTE — Progress Notes (Signed)
Diabetes Treatment Program Recommendations  ADA Standards of Care 2012 Diabetes in Pregnancy Target Glucose Ranges:  Fasting: 60 - 90 mg/dL Preprandial: 60 - 161 mg/dL 1 hr postprandial: Less than 140mg /dL (from first bite of meal) 2 hr postprandial: Less than 120 mg/dL (from first bit of meal)   10/21/11 Consulted to see newly diagnosed gestational diabetes now admitted with head and neck pain.  Pt is being treated with Fentanyl for pain and is not alert for any education.  She has been given basics of carb counting in MD's office and was to go to [facility] for further ed. Per RD there.  Pt ordered Glyburide 5 mg in the am, although RN's report pt is not eating well.  May want to use correction insulin rather than glyburide and add meal coverage once eating.  May also want to add Glyburide at HS to assist with the fasting glucose levels if NPH is not desired at this time. However, should Glyburide and diet alone not control glucose levels, the recommended starting insulin regimen would be the following: (using guidelines per Javonick-Peterson protocol: NPH 17 units in the am and at HS.  Meal coverage of 1 unit per 5 grams carbohydrate (breakfast of 30 grams - 6 units, lunch of 45 grams - 7 units, supper/dinner of 45 grams -  7 units if eating the entire meal).  110 - 119 kg  TDD 104 units  NPH 17 units am / HS  Insulin to  Carb ratio: 1 unit/5 g    For fasting dorrection insulin if needed would require (using the table/protocol) for a total daily dose of 104 units of insulin/day which starts with 4 units at 91 mg/dL, 5 units at 096 mg/dl, etc.   Glad to follow and assist as needed.  RD consult has been ordered for diet education.  I will revisit pt as needed for assistance in house. Thank you, Lenor Coffin, RN, CNS, Diabetes Coordinator [phone])

## 2011-10-21 NOTE — Progress Notes (Signed)
Ur chart review completed.  

## 2011-10-21 NOTE — Progress Notes (Signed)
HD #3  S. C/o neck and head pain since admission. Reports good FM. Denies VB, ROM, CTXs. She says she had green emesis this AM.  O.VSS, AF, TM 102.7 last night     FHR reassuring     No CVAT     Abd- benign  A/P. 30.[redacted] weeks EGA with recurring fevers.  We have been treating for a presumptive pyelonephritis, but with the discovery of the headache, stiff neck, and vomitting, I have to consider a diagnosis of meningitis. I spoke with Dr. Roseanne Reno (neurology) who feels that she does not need a neurology consult but he did suggest that I contact IR for a spinal tap. I called them and received a recording.

## 2011-10-21 NOTE — Progress Notes (Addendum)
30.[redacted] weeks gestation, with pyelonephritis, r/o meningitis.  Height  63" Weight 259 Lbs pre-pregnancy weight 236 Lbs at 15 weeks .Pre-pregnancy  BMI 41.8 (class III obesity)  IBW 115 Lbs  Total weight gain 23 Lbs. Weight gain goals 11-20 Lbs.   Estimated needs: 2000-2200 kcal/day, 72-82  grams protein/day, 2.3 liters fluid/day Carbohydrate modified gestational diet. Experiencing some nausea this morning. Current diet prescription will provide for increased needs. CBG (last 3)   Basename 10/21/11 0632 10/20/11 2045 10/20/11 1411  GLUCAP 104* 166* 140*     Nutrition Dx: Increased nutrient needs r/t pregnancy and fetal growth requirements aeb [redacted] weeks gestation.  Consult for diet education. Pt failed 3 hour GTT at [facility] and was scheduled to be [facility] at [facility] today. Received brief diet education at [facility] on 7/24.   Pt asleep after dose of fentanyl this morning. Has head and neck pain.  Copy of Carbohydrate modified gestational diet left at bedside. Will attempt diet education at a later time, when pt is alert.   Elisabeth Cara M.Odis Luster LDN Neonatal Nutrition Support Specialist Pager [phone]

## 2011-10-22 ENCOUNTER — Inpatient Hospital Stay (HOSPITAL_COMMUNITY): Payer: Medicaid Other

## 2011-10-22 DIAGNOSIS — O2300 Infections of kidney in pregnancy, unspecified trimester: Secondary | ICD-10-CM | POA: Diagnosis present

## 2011-10-22 DIAGNOSIS — R509 Fever, unspecified: Secondary | ICD-10-CM

## 2011-10-22 DIAGNOSIS — N12 Tubulo-interstitial nephritis, not specified as acute or chronic: Secondary | ICD-10-CM | POA: Diagnosis present

## 2011-10-22 LAB — GLUCOSE, CAPILLARY: Glucose-Capillary: 74 mg/dL (ref 70–99)

## 2011-10-22 LAB — URINE CULTURE

## 2011-10-22 MED ORDER — LACTATED RINGERS IV BOLUS (SEPSIS)
1000.0000 mL | Freq: Once | INTRAVENOUS | Status: AC
  Administered 2011-10-22: 1000 mL via INTRAVENOUS

## 2011-10-22 MED ORDER — CYCLOBENZAPRINE HCL 10 MG PO TABS: 10.0000 mg | ORAL_TABLET | Freq: Three times a day (TID) | ORAL | Status: DC | PRN

## 2011-10-22 MED ORDER — GLYBURIDE 2.5 MG PO TABS
2.5000 mg | ORAL_TABLET | Freq: Every day | ORAL | Status: DC
  Administered 2011-10-22: 2.5 mg via ORAL
  Filled 2011-10-22 (×2): qty 1

## 2011-10-22 NOTE — Progress Notes (Signed)
ADA Standards of Care 2012 Diabetes in Pregnancy Target Glucose Ranges:  Fasting: 60 - 90 mg/dL Preprandial: 60 - 161 mg/dL 1 hr postprandial: Less than 140mg /dL (from first bite of meal) 2 hr postprandial: Less than 120 mg/dL (from first bit of meal)    Noted low glucose this am of 50 mg/dL.  No intake and output documented, however RN's stated yesterday that patient was not eating much at all.  If patient is not eating, she does need a source of carbohydrate intake, as her glucose stores may be empty presenting potentia of hypoglycemia when the glyburide is pushing increased production of insulin.  Recommend that dextrose is added to IV fluids, i.e. D5NS or D5LR which supplies only about 24 grams of carbohydrate per liter. Thank you, Lenor Coffin, RN, CNS, Diabetes Coordinator [phone])

## 2011-10-22 NOTE — Progress Notes (Signed)
Patient ID: Christie Holder, female   DOB: [birth year], 41 y.o.   MRN: [id number] FACULTY PRACTICE ANTEPARTUM(COMPREHENSIVE) NOTE  Glorious Flicker is a 41 y.o. [id number] at [redacted]w[redacted]d by best clinical estimate who is admitted for pyelonephritis.   Fetal presentation is unsure. Length of Stay:  3  Days  Subjective: Feels better, still with some muscle aches and headache. Patient reports the fetal movement as active. Patient reports uterine contraction  activity as none. Patient reports  vaginal bleeding as none. Patient describes fluid per vagina as None.  Vitals:  Blood pressure 115/61, pulse 89, temperature 97.5 F (36.4 C), temperature source Oral, resp. rate 24, height 5\' 3"  (1.6 m), weight 117.845 kg (259 lb 12.8 oz), last menstrual period 03/21/2011, unknown if currently breastfeeding. Physical Examination:  General appearance - alert, well appearing, and in no distress Neck - supple, no significant adenopathy Abdomen - soft, nontender, nondistended, no masses or organomegaly Fundal Height:  size equals dates Pelvic Exam:  examination not indicated Extremities: extremities normal, atraumatic, no cyanosis or edema   Fetal Monitoring:  Baseline: 140 bpm, Variability: Good {> 6 bpm), Accelerations: Reactive and Decelerations: Absent  Labs:  Recent Results (from the past 24 hour(s))  GLUCOSE, CAPILLARY   Collection Time   10/21/11 12:45 PM      Component Value Range   Glucose-Capillary 136 (*) 70 - 99 mg/dL   Comment 1 Documented in Chart    GLUCOSE, CAPILLARY   Collection Time   10/21/11  7:33 PM      Component Value Range   Glucose-Capillary 75  70 - 99 mg/dL   Comment 1 Documented in Chart     Comment 2 Notify RN    GLUCOSE, CAPILLARY   Collection Time   10/22/11  7:32 AM      Component Value Range   Glucose-Capillary 50 (*) 70 - 99 mg/dL   Comment 1 Notify RN     Urine Culture--E. Coli, resistant to Septra  Medications:  Scheduled    . cefTRIAXone (ROCEPHIN)   IV  1 g Intravenous Q12H  . docusate sodium  100 mg Oral Daily  . glyBURIDE  2.5 mg Oral Q breakfast  . prenatal multivitamin  1 tablet Oral Daily  . DISCONTD: glyBURIDE  5 mg Oral Q breakfast   I have reviewed the patient's current medications.  ASSESSMENT: Patient Active Problem List  Diagnosis  . Asymptomatic bacteriuria in pregnancy  . Obesity  . Supervision of normal pregnancy  . Gestational diabetes    PLAN: Trial of flexeril for headache. Decrease her Glyburide to 2.5 mg am Home tomorrow if no fever F/u in [facility]  Michaelina Blandino S 10/22/2011,8:05 AM

## 2011-10-22 NOTE — Progress Notes (Signed)
10/22/11 1100  Clinical Encounter Type  Visited With Patient  Visit Type Initial;Spiritual support;Social support  Spiritual Encounters  Spiritual Needs Emotional    Visited with Christie Holder to make introductions and to offer chaplain support.  She reports good support from boyfriend and teenaged daughters; eager to go home tomorrow.  Pt is very pleased with and grateful for excellent care.  No concerns at this time.   [address] [place], [place] [phone]

## 2011-10-23 LAB — GLUCOSE, CAPILLARY: Glucose-Capillary: 81 mg/dL (ref 70–99)

## 2011-10-23 MED ORDER — NITROFURANTOIN MONOHYD MACRO 100 MG PO CAPS: 100.0000 mg | ORAL_CAPSULE | Freq: Two times a day (BID) | ORAL | Status: AC

## 2011-10-23 NOTE — Discharge Summary (Signed)
Physician Discharge Summary  Patient ID: Christie Holder MRN: [id number] DOB/AGE: November 18, 1970 41 y.o.  Admit date: 10/19/2011 Discharge date: 10/23/2011  Admission Diagnoses: pyelonephritis  Discharge Diagnoses: pyelonephritis Principal Problem:  *Pyelonephritis complicating pregnancy, antepartum Gestational DM  Discharged Condition: good  Hospital Course: Pt ws admitted with fever, HA and generalized malaise.  Pt started on IV atbx.  Sx improved.  Grew E.coli on urine cx.  Dx with gestational DM.  Initially started on meds which pt did not tolerate secondary to hypoglycemia. Pt seen by diabetic educator.    Pt sx resolved on atbx.  Pt afebrile greater than 24hours.  Consults: Dibetes/Nutrition  Significant Diagnostic Studies: labs: Urine cx 25k E.coli and BPP 10/10   Treatments: antibiotics: ceftriaxone  Discharge Exam: Blood pressure 126/78, pulse 95, temperature 98.2 F (36.8 C), temperature source Oral, resp. rate 24, height 5\' 3"  (1.6 m), weight 259 lb 12.8 oz (117.845 kg), last menstrual period 03/21/2011, SpO2 98.00%, unknown if currently breastfeeding. General appearance: alert and no distress Resp: clear to auscultation bilaterally Chest wall: no tenderness GI: soft, non-tender; bowel sounds normal; no masses,  no organomegaly Lymph nodes: Cervical, supraclavicular, and axillary nodes normal.  Disposition: 01-Home or Self Care   Medication List  As of 10/23/2011  8:48 AM   STOP taking these medications         acetaminophen 500 MG tablet      PYRIDIUM PO         TAKE these medications         nitrofurantoin (macrocrystal-monohydrate) 100 MG capsule   Commonly known as: MACROBID   Take 1 capsule (100 mg total) by mouth 2 (two) times daily.      prenatal multivitamin Tabs   Take 1 tablet by mouth daily.           Follow-up Information    Follow up with [facility] in 5 days. (physician to make appt)    Contact information:   [address] [phone]          Signed: Willodean Rosenthal 10/23/2011, 8:48 AM

## 2011-10-25 LAB — CULTURE, BLOOD (ROUTINE X 2): Culture: NO GROWTH

## 2011-10-28 ENCOUNTER — Encounter: Payer: Self-pay | Admitting: Physician Assistant

## 2011-11-04 ENCOUNTER — Ambulatory Visit (INDEPENDENT_AMBULATORY_CARE_PROVIDER_SITE_OTHER): Payer: Self-pay | Admitting: Obstetrics & Gynecology

## 2011-11-04 ENCOUNTER — Encounter: Payer: Self-pay | Admitting: Obstetrics & Gynecology

## 2011-11-04 ENCOUNTER — Encounter: Payer: Self-pay | Admitting: *Deleted

## 2011-11-04 ENCOUNTER — Encounter: Payer: Self-pay | Attending: Obstetrics & Gynecology | Admitting: Dietician

## 2011-11-04 VITALS — BP 101/66 | Temp 97.6°F | Wt 258.6 lb

## 2011-11-04 DIAGNOSIS — O99810 Abnormal glucose complicating pregnancy: Secondary | ICD-10-CM

## 2011-11-04 DIAGNOSIS — Z713 Dietary counseling and surveillance: Secondary | ICD-10-CM | POA: Insufficient documentation

## 2011-11-04 DIAGNOSIS — O24419 Gestational diabetes mellitus in pregnancy, unspecified control: Secondary | ICD-10-CM

## 2011-11-04 DIAGNOSIS — O0990 Supervision of high risk pregnancy, unspecified, unspecified trimester: Secondary | ICD-10-CM

## 2011-11-04 LAB — POCT URINALYSIS DIP (DEVICE)
Leukocytes, UA: NEGATIVE
Protein, ur: NEGATIVE mg/dL
Specific Gravity, Urine: 1.02 (ref 1.005–1.030)
Urobilinogen, UA: 0.2 mg/dL (ref 0.0–1.0)

## 2011-11-04 NOTE — Progress Notes (Signed)
Diabetes Education:  Seen today for meter instructions.  Provided meter and instructed to monitor blood glucose fasting and 2 hours after the first bite of each meal.  To record readings and to bring meter and glucose log to each clinic appointment.  On return demonstration at 11:50 AM, her glucose was 114 mg and she had eaten earlier in the day.  Provided a True Track Meter Kit Lot: [id number]  Exp: 2013/10/22.  Provided 1 box strips Lot: [id number] Exp: 2014/01/22.  Provided 1 box lancets Lot: [id number] Exp: 2015/08/26.  Maggie Erminio Nygard, RN, RD, CDE

## 2011-11-04 NOTE — Progress Notes (Signed)
Pulse: 82

## 2011-11-04 NOTE — Progress Notes (Signed)
Nutrition Note: 1st visit consult Pt has hx of obesity & GDM. Pt seen for GDM diet education. Pt has gained 42.6# @[redacted]w[redacted]d , which is > expected. Pt reports eating 3 meals & 2 snacks/ d. Pt reports drinking water & milk. Pt reports taking PNV but no other meds. Pt reports no N/V & no food allergies. Pt reports walking occ but that her feet hurt. Pt given verbal & written GDM education. Disc wt gain goals & encouraged more physical activity after pregnancy to help wt loss. Pt agrees to follow GDM diet including 3 meals & 3 snacks with proper CHO/pro combination. Pt receives [facility] services & plans to BF. F/u in 2-4 wks Blondell Reveal, MS, RD, LDN

## 2011-11-04 NOTE — Patient Instructions (Signed)
Amamantar al beb (Breastfeeding) LOS BENEFICIOS DE AMAMANTAR Para el beb  La primera leche (calostro ) ayuda al mejor funcionamiento del sistema digestivo del beb.   La leche tiene anticuerpos que provienen de la madre y que ayudan a prevenir las infecciones en el beb.   Hay una menor incidencia de asma, enfermedades alrgicas y SMSI (sndrome de muerte sbita nfantil).   Los nutrientes que contiene la leche materna son mejores que las frmulas para el bibern y favorecen el desarrollo cerebral.   Los bebs amamantados sufren menos gases, clicos y constipacin.  Para la mam  La lactancia materna favorece el desarrollo de un vnculo muy especial entre la madre y el beb.   Es ms conveniente, siempre disponible a la temperatura adecuada y ms econmica que la leche maternizada.   Consume caloras en la madre y la ayuda a perder el peso ganado durante el embarazo.   Favorece la contraccin del tero a su tamao normal, de manera ms rpida y disminuye las hemorragias luego del parto.   Las madres que amamantan tienen menor riesgo de desarrollar cncer de mama.  AMAMNTELO CON FRECUENCIA  Un beb sano, nacido a trmino, puede amamantarse con tanta frecuencia como cada hora, o espaciar las comidas cada tres horas.   Esta frecuencia variar de un beb a otro. Observe al beb cuando manifieste signos de hambre, antes que regirse por el reloj.   Amamntelo tan seguido como el beb lo solicite, o cuando usted sienta la necesidad de aliviar sus mamas.   Despierte al beb si han pasado 3  4 horas desde la ltima comida.   El amamantamiento frecuente la ayudar a producir ms leche y a prevenir problemas de dolor en los pezones e hinchazn de las mamas.  LA POSICIN DEL BEB PARA AMAMANTARLO  Ya sea que se encuentre acostada o sentada, asegrese que el abdomen del beb enfrente el suyo.   Sostenga la mama con el pulgar por arriba y el resto de los dedos por debajo. Asegrese que  sus dedos se encuentren lejos del pezn y de la boca del beb.   Toque suavemente los labios del beb y la mejilla ms cercana a la mama con el dedo o el pezn.   Cuando la boca del beb se abra lo suficiente, introduzca el pezn y la zona oscura que lo rodea tanto como le sea posible dentro de la boca.   Coloque a beb cerca suyo de modo que su nariz y mejillas toquen las mamas al mamar.  LAS COMIDAS  La duracin de cada comida vara de un beb a otro y de una comida a otra.   El beb debe succionar alrededor de dos o tres minutos para que le llegue leche. Esto se denomina "bajada". Por este motivo, permita que el nio se alimente en cada mama todo lo que desee. Terminar de mamar cuando haya recibido la cantidad adecuada de nutrientes.   Para detener la succin coloque su dedo en la comisura de la boca del nio y deslcelo entre sus encas antes de quitarle la mama de la boca. Esto la ayudar a evitar el dolor en los pezones.  REDUCIR LA CONGESTIN DE LAS MAMAS  Durante la primera semana despus del parto, usted puede experimentar congestin en las mamas. Cuando las mamas estn congestionadas, se sienten calientes, llenas y molestas al tacto. Puede reducir la congestin si:   Lo amamanta frecuentemente, cada 2-3 horas. Las mams que amamantan pronto y con frecuencia tienen menos problemas   de congestin.   Coloque bolsas fras livianas entre cada mamada. Esto ayuda a reducir la hinchazn. Envuelva las bolsas de hielo en una toalla liviana para proteger su piel.   Aplique compresas hmedas calientes sobre la mama durante 5 a 10 minutos antes de amamantar al nio. Esto aumenta la circulacin y ayuda a que la leche fluya.   Masajee suavemente la mama antes y durante la alimentacin.   Asegrese que el nio vaca al menos una mama antes de cambiar de lado.   Use un sacaleche para vaciar la mama si el beb se duerme o no se alimenta bien. Tambin podr quitarse la leche con esta bomba si  tiene que volver al trabajo o siente que las mamas estn congestionadas.   Evite los biberones, chupetes o complementar la alimentacin con agua o jugos en lugar de la leche materna.   Verifique que el beb se encuentra en la posicin correcta mientras lo alimenta.   Evite el cansancio, el estrs y la anemia   Use un soutien que sostenga bien sus mamas y evite los que tienen aro.   Consuma una dieta balanceada y beba lquidos en cantidad.  Si sigue estas indicaciones, la congestin debe mejorar en 24 a 48 horas. Si an tiene dificultades, consulte a su asesor en lactancia. TENDR SUFICIENTE LECHE MI BEB? Algunas veces las madres se preocupan acerca de si sus bebs tendrn la leche suficiente. Puede asegurarse que el beb tiene la leche suficiente si:  El beb succiona y escucha que traga activamente.   El nio se alimenta al menos 8 a 12 veces en 24 horas. Alimntelo hasta que se desprenda por sus propios medios o se quede dormido en la primera mama (al menos durante 10 a 20 minutos), luego ofrzcale el otro lado.   El beb moja 5 a 6 paales descartables (6 a 8 paales de tela) en 24 horas cuando tiene 5  6 das de vida.   Tiene al menos 2-3 deposiciones todos los das en los primeros meses. La leche materna es todo el alimento que el beb necesita. No es necesario que el nio ingiera agua o preparados de bibern. De hecho, para ayudar a que sus mamas produzcan ms leche, lo mejor es no darle al beb suplementos durante las primeras semanas.   La materia fecal debe ser blanda y amarillenta.   El beb debe aumentar 112 a 196 g por semana.  CUDESE Cuide sus mamas del siguiente modo:  Bese o dchese diariamente.   No lave sus pezones con jabn.   Comience a amamantar del lado izquierdo en una comida y del lado derecho en la siguiente.   Notar que aumenta el suministro de leche a los 2 a 5 das despus del parto. Puede sentir algunas molestias por la congestin, lo que hace que  sus mamas estn duras y sensibles. La congestin disminuye en 24 a 48 horas. Mientras tanto, aplique toallas hmedas calientes durante 5 a 10 minutos antes de amamantar. Un masaje suave y la extraccin de un poco de leche antes de amamantar ablandarn las mamas y har ms fcil que el beb se agarre. Use un buen sostn y seque al aire los pezones durante 10 a 15 minutos luego de cada alimentacin.   Solo utilice apsitos de algodn.   Utilice lanolina pura sobre los pezones luego de amamantar. No necesita lavarlos luego de alimentar al nio.  Cudese del siguiente modo:   Consuma alimentos bien balanceados y refrigerios nutritivos.     Dixie Dials, jugos de fruta y agua para Warehouse manager sed (alrededor de 8 vasos por Futures trader).   Descanse lo suficiente.   Aumente la ingesta de calcio en la dieta (1200mg /da).   Evite los alimentos que usted nota que puedan afectar al beb.  SOLICITE ATENCIN MDICA SI:  Tiene preguntas que formular o dificultades con la alimentacin a pecho.   Necesita ayuda.   Observa una zona dura, roja y que le duele en la zona de la mama, y se acompaa de fiebre de 100.5 F (38.1 C) o ms.   El beb est muy somnoliento como para alimentarse bien o tiene problemas para dormir.   El beb moja menos de 6 paales por da, a partir de los [address] de [place].   La piel del beb o la parte blanca de sus ojos est ms amarilla de lo que estaba en el hospital.   Se siente deprimida.  Document Released: 03/11/2005 Document Revised: 02/28/2011 [facility] Patient Information 2012 [place], [place].Eleccin del mtodo anticonceptivo  (Contraception Choices) La anticoncepcin (control de la natalidad) es el uso de cualquier mtodo o dispositivo para Location manager. A continuacin se indican algunos de esos mtodos.  MTODOS HORMONALES   Implante anticonceptivo. Es un tubo plstico delgado que contiene la hormona progesterona. No contiene estrgenos. El mdico inserta el tubo en la  parte interna del brazo. El tubo puede Geneticist, molecular durante 3 aos. Despus de los 3 aos debe retirarse. El implante impide que los ovarios liberen vulos (ovulacin), espesa el moco cervical, lo que evita que los espermatozoides ingresen al tero y hace ms delgada la membrana que cubre el interior del tero.   Inyecciones de progesterona sola. Estas inyecciones se administran cada 3 meses para evitar el embarazo. La progesterona sinttica impide que los ovarios liberen vulos. Tambin hace que el moco cervical se espese y modifica el recubrimiento interno del tero. Esto hace ms difcil que los espermatozoides sobrevivan en el tero.   Pldoras anticonceptivas. Las pldoras anticonceptivas contienen estrgenos y Education officer, museum. Actan impidiendo que el vulo se forme en el ovario(ovulacin). Las pldoras anticonceptivas son recetadas por el mdico.Tambin se utilizan para tratar los perodos menstruales abundantes.   Minipldora. Este tipo de pldora anticonceptiva contiene slo hormona progesterona. Deben tomarse todos los [address]  [address] del mes y debe recetarlas el mdico.   Parches anticonceptivos. El parche contiene hormonas similares a las que contienen las pldoras anticonceptivas. Deben cambiarse una vez por semana y se utilizan bajo prescripcin mdica.   Anillo vaginal. Anillo vaginal contiene hormonas similares a las que contienen las pldoras anticonceptivas. Se deja colocado durante tres semanas, se lo retira durante 1 semana y luego se coloca uno nuevo. La paciente debe sentirse cmoda para insertar y retirar el anillo de la vagina.Es necesaria la receta del mdico.   Anticonceptivos de Associate Professor. Los anticonceptivos de emergencia son mtodos para evitar un embarazo despus de una relacin sexual sin proteccin. Esta pldora puede tomarse inmediatamente despus de Child psychotherapist sexuales o hasta 5 [place] de haber tenido sexo sin proteccin. Es ms efectiva si se toma poco tiempo  despus. Los anticonceptivos de emergencia estn disponibles sin prescripcin mdica. Consltelo con su farmacutico. No use los anticonceptivos de emergencia como nico mtodo anticonceptivo.  MTODOS DE BARRERA   Condn masculino. Es una vaina delgada (ltex o goma) que se [place] en el pene durante el acto sexual. Deri Fuelling con espermicida para aumentar la efectividad.   Condn femenino. Es una vaina blanda y [place]  se adapta suavemente a la vagina antes de las [employer].   Diafragma. Es una barrera de ltex redonda y Casimer Bilis que debe ser ajustada por un profesional. Se inserta en la vagina, junto con un gel espermicida. Debe insertarse antes de Management consultant. Debe dejar el diafragma colocado en la vagina durante 6 a 8 horas despus de la relacin sexual.   Capuchn cervical. Es una taza de ltex o plstico, redonda y [place] que cubre el cuello del tero y debe ser ajustada por un mdico. Puede dejarlo colocado en la vagina hasta 48 horas despus de las Clinical research associate.   Esponja. Es una pieza blanda y circular de espuma de poliuretano. Contiene un espermicida. Se inserta en la vagina despus de mojarla y antes de las [employer].   Espermicidas. Los espermicidas son qumicos que matan o bloquean el esperma y no lo dejan ingresar al cuello del tero y al tero. Vienen en forma de cremas, geles, supositorios, espuma o comprimidos. No es necesario tener Emergency planning/management officer. Se insertan en la vagina con un aplicador antes de Management consultant. El proceso debe repetirse cada vez que tiene relaciones sexuales.  ANTICONCEPTIVOS INTRAUTERINOS   Dispositivo intrauterino (DIU). Es un dispositivo en forma de T que se coloca en el tero durante el perodo menstrual, para Location manager. Hay dos tipos:   DIU de cobre. Este tipo de DIU est recubierto con un alambre de cobre y se inserta dentro del tero. El cobre hace que el tero y las trompas de Falopio produzcan un  liquido que [employer] espermatozoides. Puede permanecer colocado durante 10 aos.   DIU hormonal. Este tipo de DIU contiene la hormona progestina (progesterona sinttica). La hormona espesa el moco cervical y evita que los espermatozoides ingresen al tero y tambin afina la membrana que cubre el tero para evitar la implantacin del vulo fertilizado. La hormona debilita o destruye los espermatozoides que ingresan al tero. Puede permanecer colocado durante 5 aos.  MTODOS ANTICONCEPTIVOS PERMANENTES   Ligadura de trompas en la mujer. La ligadura de trompas en la mujer se realiza sellando, atando u obstruyendo quirrgicamente las trompas de Falopio lo que impide que el vulo descienda hacia el tero.   Esterilizacin masculina. Se realiza atando los conductos por los que pasan los espermatozoides (vasectoma).Esto impide que el esperma ingrese a la vagina durante el acto sexual. Luego del procedimiento, el hombre puede eyacular lquido (semen).  MTODOS DE PLANIFICACIN NATURAL   Planificacin familiar natural.  Consiste en no tener relaciones sexuales o usar un mtodo de barrera (condn, [place]osia, capuchn cervical) en los [employer] la mujer podra quedar [place].   Mtodo calendario.  Consiste en el seguimiento de la duracin de cada ciclo menstrual y la identificacin de los perodos frtiles.   Mtodo de Occupational hygienist.  Consiste en evitar las relaciones sexuales durante la ovulacin.   Mtodo sintotrmico. Paramedic las relaciones sexuales en la poca en la que se est ovulando, utilizando un termmetro y tendiendo en cuenta los sntomas de la ovulacin.   Mtodo post-ovulacin. Consiste en planificar las relaciones sexuales para despus de haber ovulado.  Independientemente del tipo o mtodo anticonceptivo que usted elija, es importante que use condones para protegerse contra las enfermedades de transmisin sexual (ETS). Hable con su mdico con respecto a qu mtodo  anticonceptivo es el ms apropiado para usted.  Document Released: 03/11/2005 Document Revised: 02/28/2011 [facility] Patient Information 2012 [place], [place].

## 2011-11-04 NOTE — Progress Notes (Signed)
[place] for growth.  Morbid obesity and new diagnosed GDM.  Meet with Diabetes education.

## 2011-11-11 ENCOUNTER — Ambulatory Visit (INDEPENDENT_AMBULATORY_CARE_PROVIDER_SITE_OTHER): Payer: Self-pay | Admitting: Family Medicine

## 2011-11-11 VITALS — BP 103/65 | Temp 97.6°F | Wt 261.4 lb

## 2011-11-11 DIAGNOSIS — O24419 Gestational diabetes mellitus in pregnancy, unspecified control: Secondary | ICD-10-CM

## 2011-11-11 DIAGNOSIS — O99810 Abnormal glucose complicating pregnancy: Secondary | ICD-10-CM

## 2011-11-11 LAB — POCT URINALYSIS DIP (DEVICE)
Glucose, UA: NEGATIVE mg/dL
Hgb urine dipstick: NEGATIVE
Nitrite: NEGATIVE
Urobilinogen, UA: 0.2 mg/dL (ref 0.0–1.0)

## 2011-11-11 MED ORDER — GLYBURIDE 2.5 MG PO TABS: 5.0000 mg | ORAL_TABLET | Freq: Two times a day (BID) | ORAL | Status: DC

## 2011-11-11 NOTE — Patient Instructions (Signed)
Pregnancy - Third Trimester The third trimester of pregnancy (the last 3 months) is a period of the most rapid growth for you and your baby. The baby approaches a length of 20 inches and a weight of 6 to 10 pounds. The baby is adding on fat and getting ready for life outside your body. While inside, babies have periods of sleeping and waking, suck their thumbs, and hiccups. You can often feel small contractions of the uterus. This is false labor. It is also called Braxton-Hicks contractions. This is like a practice for labor. The usual problems in this stage of pregnancy include more difficulty breathing, swelling of the hands and feet from water retention, and having to urinate more often because of the uterus and baby pressing on your bladder.  PRENATAL EXAMS  Blood work may continue to be done during prenatal exams. These tests are done to check on your health and the probable health of your baby. Blood work is used to follow your blood levels (hemoglobin). Anemia (low hemoglobin) is common during pregnancy. Iron and vitamins are given to help prevent this. You may also continue to be checked for diabetes. Some of the past blood tests may be done again.   The size of the uterus is measured during each visit. This makes sure your baby is growing properly according to your pregnancy dates.   Your blood pressure is checked every prenatal visit. This is to make sure you are not getting toxemia.   Your urine is checked every prenatal visit for infection, diabetes and protein.   Your weight is checked at each visit. This is done to make sure gains are happening at the suggested rate and that you and your baby are growing normally.   Sometimes, an ultrasound is performed to confirm the position and the proper growth and development of the baby. This is a test done that bounces harmless sound waves off the baby so your caregiver can more accurately determine due dates.   Discuss the type of pain  medication and anesthesia you will have during your labor and delivery.   Discuss the possibility and anesthesia if a Cesarean Section might be necessary.   Inform your caregiver if there is any mental or physical violence at home.  Sometimes, a specialized non-stress test, contraction stress test and biophysical profile are done to make sure the baby is not having a problem. Checking the amniotic fluid surrounding the baby is called an amniocentesis. The amniotic fluid is removed by sticking a needle into the belly (abdomen). This is sometimes done near the end of pregnancy if an early delivery is required. In this case, it is done to help make sure the baby's lungs are mature enough for the baby to live outside of the womb. If the lungs are not mature and it is unsafe to deliver the baby, an injection of cortisone medication is given to the mother 1 to 2 days before the delivery. This helps the baby's lungs mature and makes it safer to deliver the baby. CHANGES OCCURING IN THE THIRD TRIMESTER OF PREGNANCY Your body goes through many changes during pregnancy. They vary from person to person. Talk to your caregiver about changes you notice and are concerned about.  During the last trimester, you have probably had an increase in your appetite. It is normal to have cravings for certain foods. This varies from person to person and pregnancy to pregnancy.   You may begin to get stretch marks on your hips,   abdomen, and breasts. These are normal changes in the body during pregnancy. There are no exercises or medications to take which prevent this change.   Constipation may be treated with a stool softener or adding bulk to your diet. Drinking lots of fluids, fiber in vegetables, fruits, and whole grains are helpful.   Exercising is also helpful. If you have been very active up until your pregnancy, most of these activities can be continued during your pregnancy. If you have been less active, it is helpful  to start an exercise program such as walking. Consult your caregiver before starting exercise programs.   Avoid all smoking, alcohol, un-prescribed drugs, herbs and "street drugs" during your pregnancy. These chemicals affect the formation and growth of the baby. Avoid chemicals throughout the pregnancy to ensure the delivery of a healthy infant.   Backache, varicose veins and hemorrhoids may develop or get worse.   You will tire more easily in the third trimester, which is normal.   The baby's movements may be stronger and more often.   You may become short of breath easily.   Your belly button may stick out.   A yellow discharge may leak from your breasts called colostrum.   You may have a bloody mucus discharge. This usually occurs a few days to a week before labor begins.  HOME CARE INSTRUCTIONS   Keep your caregiver's appointments. Follow your caregiver's instructions regarding medication use, exercise, and diet.   During pregnancy, you are providing food for you and your baby. Continue to eat regular, well-balanced meals. Choose foods such as meat, fish, milk and other low fat dairy products, vegetables, fruits, and whole-grain breads and cereals. Your caregiver will tell you of the ideal weight gain.   A physical sexual relationship may be continued throughout pregnancy if there are no other problems such as early (premature) leaking of amniotic fluid from the membranes, vaginal bleeding, or belly (abdominal) pain.   Exercise regularly if there are no restrictions. Check with your caregiver if you are unsure of the safety of your exercises. Greater weight gain will occur in the last 2 trimesters of pregnancy. Exercising helps:   Control your weight.   Get you in shape for labor and delivery.   You lose weight after you deliver.   Rest a lot with legs elevated, or as needed for leg cramps or low back pain.   Wear a good support or jogging bra for breast tenderness during  pregnancy. This may help if worn during sleep. Pads or tissues may be used in the bra if you are leaking colostrum.   Do not use hot tubs, steam rooms, or saunas.   Wear your seat belt when driving. This protects you and your baby if you are in an accident.   Avoid raw meat, cat litter boxes and soil used by cats. These carry germs that can cause birth defects in the baby.   It is easier to loose urine during pregnancy. Tightening up and strengthening the pelvic muscles will help with this problem. You can practice stopping your urination while you are going to the bathroom. These are the same muscles you need to strengthen. It is also the muscles you would use if you were trying to stop from passing gas. You can practice tightening these muscles up 10 times a set and repeating this about 3 times per day. Once you know what muscles to tighten up, do not perform these exercises during urination. It is more likely   to cause an infection by backing up the urine.   Ask for help if you have financial, counseling or nutritional needs during pregnancy. Your caregiver will be able to offer counseling for these needs as well as refer you for other special needs.   Make a list of emergency phone numbers and have them available.   Plan on getting help from family or friends when you go home from the hospital.   Make a trial run to the hospital.   Take prenatal classes with the father to understand, practice and ask questions about the labor and delivery.   Prepare the baby's room/nursery.   Do not travel out of the city unless it is absolutely necessary and with the advice of your caregiver.   Wear only low or no heal shoes to have better balance and prevent falling.  MEDICATIONS AND DRUG USE IN PREGNANCY  Take prenatal vitamins as directed. The vitamin should contain 1 milligram of folic acid. Keep all vitamins out of reach of children. Only a couple vitamins or tablets containing iron may be fatal  to a baby or young child when ingested.   Avoid use of all medications, including herbs, over-the-counter medications, not prescribed or suggested by your caregiver. Only take over-the-counter or prescription medicines for pain, discomfort, or fever as directed by your caregiver. Do not use aspirin, ibuprofen (Motrin, Advil, Nuprin) or naproxen (Aleve) unless OK'd by your caregiver.   Let your caregiver also know about herbs you may be using.   Alcohol is related to a number of birth defects. This includes fetal alcohol syndrome. All alcohol, in any form, should be avoided completely. Smoking will cause low birth rate and premature babies.   Street/illegal drugs are very harmful to the baby. They are absolutely forbidden. A baby born to an addicted mother will be addicted at birth. The baby will go through the same withdrawal an adult does.  SEEK MEDICAL CARE IF: You have any concerns or worries during your pregnancy. It is better to call with your questions if you feel they cannot wait, rather than worry about them. DECISIONS ABOUT CIRCUMCISION You may or may not know the sex of your baby. If you know your baby is a boy, it may be time to think about circumcision. Circumcision is the removal of the foreskin of the penis. This is the skin that covers the sensitive end of the penis. There is no proven medical need for this. Often this decision is made on what is popular at the time or based upon religious beliefs and social issues. You can discuss these issues with your caregiver or pediatrician. SEEK IMMEDIATE MEDICAL CARE IF:   An unexplained oral temperature above 102 F (38.9 C) develops, or as your caregiver suggests.   You have leaking of fluid from the vagina (birth canal). If leaking membranes are suspected, take your temperature and tell your caregiver of this when you call.   There is vaginal spotting, bleeding or passing clots. Tell your caregiver of the amount and how many pads are  used.   You develop a bad smelling vaginal discharge with a change in the color from clear to white.   You develop vomiting that lasts more than 24 hours.   You develop chills or fever.   You develop shortness of breath.   You develop burning on urination.   You loose more than 2 pounds of weight or gain more than 2 pounds of weight or as suggested by your   caregiver.   You notice sudden swelling of your face, hands, and feet or legs.   You develop belly (abdominal) pain. Round ligament discomfort is a common non-cancerous (benign) cause of abdominal pain in pregnancy. Your caregiver still must evaluate you.   You develop a severe headache that does not go away.   You develop visual problems, blurred or double vision.   If you have not felt your baby move for more than 1 hour. If you think the baby is not moving as much as usual, eat something with sugar in it and lie down on your left side for an hour. The baby should move at least 4 to 5 times per hour. Call right away if your baby moves less than that.   You fall, are in a car accident or any kind of trauma.   There is mental or physical violence at home.  Document Released: 03/05/2001 Document Revised: 02/28/2011 Document Reviewed: 09/07/2008 [facility] Patient Information 2012 [place], [place].   Diabetes mellitus gestacional (Gestational Diabetes Mellitus) La diabetes mellitus gestacional se produce slo durante el embarazo. Aparece cuando el organismo no puede controlar adecuadamente la glucosa (azcar) que aumenta en la sangre despus de comer. Durante el [place], se produce una resistencia a la insulina (sensibilidad reducida a la insulina) debido a la liberacin de hormonas por parte de la placenta. Generalmente, el pncreas de una mujer embarazada produce la cantidad suficiente de insulina para vencer esa resistencia. Sin embargo, en la diabetes gestacional, hay insulina pero no cumple su funcin adecuadamente. Si la  resistencia es lo suficientemente grave como para que el pncreas no produzca la cantidad de insulina suficiente, la glucosa extra se acumula en la sangre.  Devota Pace RIESGO DE DESARROLLAR DIABETES GESTACIONAL?  Las mujeres con historia de diabetes en la familia.   Las mujeres de ms de [address]   Las que presentan sobrepeso.   Las [employer] pertenecen a ciertos grupos tnicos (latinas, afroamericanas, norteamericanas nativas, asiticas y las originarias de las islas del Pacfico.  QUE PUEDE OCURRIRLE AL BEB? Si el nivel de glucosa en sangre de la madre es demasiado elevado mientras este [place], el nivel extra de azcar pasar por el cordn umbilical hacia el beb. Algunos de los problemas del beb pueden ser:  Beb demasiado grande: si el nio recibe Chief Strategy Officer, puede aumentar mucho de [place]. Esto puede hacer que sea demasiado grande para nacer por parto normal (vaginal) por lo que ser necesario realizar una cesrea.   Bajo nivel de glucosa (hipoglucemia): el beb produce insulina extra en respuesta a la excesiva cantidad de azcar que obtiene de [employer]. Cuando el beb nace y ya no necesita insulina extra, su nivel de azcar en sangre puede disminuir.   Ictericia (coloracin amarillenta de la piel y los ojos): esto es bastante frecuente en los bebs. La causa es la acumulacin de una sustancia qumica denominada bilirrubina. No siempre es un trastorno grave, pero se observa con frecuencia en los bebs cuyas madres sufren diabetes gestacional.  RIESGOS PARA LA MADRE Las mujeres que han sufrido diabetes gestacional pueden tener ms riesgos para algunos problemas como:  Preeclampsia o toxemia, incluyendo problemas con hipertensin arterial. La presin arterial y los niveles de protenas en la orina deben controlarse con frecuencia.   Infecciones   Parto por cesrea.   Aparicin de diabetes tipo 2 en una etapa posterior de la vida. Alrededor del 30% al 50% sufrir  diabetes posteriormente, especialmente las que son obesas.  DIAGNSTICO Las  hormonas que causan resistencia a la insulina tienen su mayor nivel alrededor de las 24 a 28 semanas del Psychiatrist. Si se experimentan sntomas, stos son similares a los sntomas que normalmente aparecen durante el embarazo.  La diabetes mellitus gestacional generalmente se diagnostica por medio de un mtodo en dos partes: 1. Despus de la 24 a 28 semanas de Psychiatrist, la mujer debe beber una solucin que contiene glucosa y Education officer, environmental un anlisis de [employer]. Si el nivel de glucosa es elevado, la realizarn un segundo [place].  2. La prueba oral de tolerancia a la glucosa, que dura aproximadamente tres horas. Despus de realizar ayuno durante la noche, se controla nivel de glucosa en sangre. La mujer bebe una solucin que contiene glucosa y Chief Executive Officer realizan anlisis de glucosa en sangre cada hora.  Si la mujer tiene factores de riesgos para la diabetes mellitus gestacional, el mdico podr Programme researcher, broadcasting/film/video anlisis antes de las 24 semanas de [place]. TRATAMIENTO El tratamiento est dirigido a Insurance underwriter en sangre de la madre en un nivel normal y puede incluir:  La planificacin de los alimentos.   Recibir insulina u otro medicamento para Sales executive nivel de glucosa en [place].   La prctica de ejercicios.   Llevar un registro diario de los alimentos que consume.   Control y Engineer, maintenance (IT) de los niveles de glucosa en [place].   Control de los niveles de cetona en la [place], [place] esto ya no se considera necesario en la mayora de los [place].  INSTRUCCIONES PARA EL CUIDADO DOMICILIARIO Mientras est embarazada:  Siga los consejos de su mdico relacionados con los controles prenatales, la planificacin de la comida, la actividad fsica, los [place], vitaminas, los anlisis de sangre y otras pruebas y las actividades fsicas.   Lleve un registro de las comidas, las pruebas de glucosa en sangre y la cantidad de insulina  que recibe (si corresponde). Muestre todo al profesional en cada consulta mdica prenatal.   Si sufre diabetes mellitus gestacional, podr tener problemas de hipoglucemia (nivel bajo de glucosa en sangre). Podr sospechar este problema si se siente repentinamente mareada, tiene temblores y/o se siente dbil. Si cree que esto le est ocurriendo, y tiene un medidor de glucosa, mida su nivel de Event organiser. Siga los consejos de su mdico sobre el modo y el momento de tratar su nivel de glucosa en sangre. Generalmente se sigue la regla 15:15 Consuma 15 g de hidratos de carbono, espere 15 minutos y Programmer, systems el nivel de glucosa en [place].Barbara Cower de 15 g de hidratos de carbono son:   1 taza de [employer].    taza de jugo.   3-4 tabletas de glucosa.   5-6 caramelos duros.   1 caja pequea de pasas de uva.    taza de gaseosa comn.   Mantenga una buena higiene para evitar infecciones.   No fume.  SOLICITE ATENCIN MDICA SI:  Observa prdida vaginal con o sin picazn.   Se siente ms dbil o cansada que lo habitual.   [place].   Tiene un aumento de peso repentino, 2,5 kg o ms en una semana.   Pierde peso, 1.5 kg o ms en una semana.   Su nivel de glucosa en sangre es elevado, necesita instrucciones.  SOLICITE ATENCIN MDICA DE INMEDIATO SI:  Sufre una cefalea intensa.   Se marea o pierde el conocimiento   Presenta nuseas o vmitos.   Se siente desorientada confundida.   Sufre convulsiones.   Tiene problemas de  visin.   Siente Physiological scientist.   Presenta una hemorragia vaginal abundante.   Tiene contracciones uterinas.   Tiene una prdida importante de lquido por la vagina  DESPUS QUE NACE EL BEB:  Concurra a todos los controles de seguimiento y Clinical biochemist los anlisis de sangre segn las indicaciones de su mdico.   Mantenga un estilo de vida saludable para evitar la diabetes en el futuro. Aqu se incluye:   Siga el plan de  alimentacin saludable.   Controle su peso.   Practique actividad fsica y descanse lo necesario.   No fume.   Amamante a su beb mientras pueda. Esto disminuir la probabilidad de que usted y su beb sufran diabetes posteriormente.  Para ms informacin acerca de la diabetes, visite la pgina web de Holiday representative Diabetes Association: PMFashions.com.cy. Para ms informacin acerca de la diabetes gestacional cite la pgina web del [employer] Obst[employer]ians [employer] Gynecologists en: RentRule.[email or web address]. Document Released: 12/19/2004 Document Revised: 02/28/2011 [facility] Patient Information 2012 [place], [place].   Gestational Diabetes Mellitus Gestational diabetes mellitus (GDM) is diabetes that occurs only during pregnancy. This happens when the body cannot properly handle the glucose (sugar) that increases in the blood after eating. During pregnancy, insulin resistance (reduced sensitivity to insulin) occurs because of the release of hormones from the placenta. Usually, the pancreas of pregnant women produces enough insulin to overcome the resistance that occurs. However, in gestational diabetes, the insulin is there but it does not work effectively. If the resistance is severe enough that the pancreas does not produce enough insulin, extra glucose builds up in the blood.  WHO IS AT RISK FOR DEVELOPING GESTATIONAL DIABETES?  Women with a history of diabetes in the family.   Women over age 78.   Women who are overweight.   Women in certain ethnic groups (Hispanic, African American, Native American, [place] and [place] Islander).  WHAT CAN HAPPEN TO THE BABY? If the mother's blood glucose is too high while she is pregnant, the extra sugar will travel through the umbilical cord to the baby. Some of the problems the baby may have are:  Large Baby - If the baby receives too much sugar, the baby will gain more weight. This may cause the baby to be too large to be born normally  (vaginally) and a Cesarean section (C-section) may be needed.   Low Blood Glucose (hypoglycemia) - The baby makes extra insulin, in response to the extra sugar its gets from its mother. When the baby is born and no longer needs this extra insulin, the baby's blood glucose level may drop.   Jaundice (yellow coloring of the skin and eyes) - This is fairly common in babies. It is caused from a build-up of the chemical called bilirubin. This is rarely serious, but is seen more often in babies whose mothers had gestational diabetes.  RISKS TO THE MOTHER Women who have had gestational diabetes may be at higher risk for some problems, including:  Preeclampsia or toxemia, which includes problems with high blood pressure. Blood pressure and protein levels in the urine must be checked frequently.   Infections.   Cesarean section (C-section) for delivery.   Developing Type 2 diabetes later in life. About 30-50% will develop diabetes later, especially if obese.  DIAGNOSIS  The hormones that cause insulin resistance are highest at about 24-28 weeks of pregnancy. If symptoms are experienced, they are much like symptoms you would normally expect during pregnancy.  GDM is often diagnosed using a two  part method: 1. After 24-28 weeks of pregnancy, the woman drinks a glucose solution and takes a blood test. If the glucose level is high, a second test will be given.  2. Oral Glucose Tolerance Test (OGTT) which is 3 hours long - After not eating overnight, the blood glucose is checked. The woman drinks a glucose solution, and hourly blood glucose tests are taken.  If the woman has risk factors for GDM, the caregiver may test earlier than 24 weeks of pregnancy. TREATMENT  Treatment of GDM is directed at keeping the mother's blood glucose level normal, and may include:  Meal planning.   Taking insulin or other medicine to control your blood glucose level.   Exercise.   Keeping a daily record of the foods you  eat.   Blood glucose monitoring and keeping a record of your blood glucose levels.   May monitor ketone levels in the urine, although this is no longer considered necessary in most pregnancies.  HOME CARE INSTRUCTIONS  While you are pregnant:  Follow your caregiver's advice regarding your prenatal appointments, meal planning, exercise, medicines, vitamins, blood and other tests, and physical activities.   Keep a record of your meals, blood glucose tests, and the amount of insulin you are taking (if any). Show this to your caregiver at every prenatal visit.   If you have GDM, you may have problems with hypoglycemia (low blood glucose). You may suspect this if you become suddenly dizzy, feel shaky, and/or weak. If you think this is happening and you have a glucose meter, try to test your blood glucose level. Follow your caregiver's advice for when and how to treat your low blood glucose. Generally, the 15:15 rule is followed: Treat by consuming 15 grams of carbohydrates, wait 15 minutes, and recheck blood glucose. Examples of 15 grams of carbohydrates are:   1 cup skim or low-fat milk.    cup juice.   3-4 glucose tablets.   5-6 hard candies.   1 small box raisins.    cup regular soda pop.   Practice good hygiene, to avoid infections.   Do not smoke.  SEEK MEDICAL CARE IF:   You develop abnormal vaginal discharge, with or without itching.   You become weak and tired more than expected.   You seem to sweat a lot.   You have a sudden increase in weight, 5 pounds or more in one week.   You are losing weight, 3 pounds or more in a week.   Your blood glucose level is high, and you need instructions on what to do about it.  SEEK IMMEDIATE MEDICAL CARE IF:   You develop a severe headache.   You faint or pass out.   You develop nausea and vomiting.   You become disoriented or confused.   You have a convulsion.   You develop vision problems.   You develop stomach pain.     You develop vaginal bleeding.   You develop uterine contractions.   You have leaking or a gush of fluid from the vagina.  AFTER YOU HAVE THE BABY:  Go to all of your follow-up appointments, and have blood tests as advised by your caregiver.   Maintain a healthy lifestyle, to prevent diabetes in the future. This includes:   Following a healthy meal plan.   Controlling your weight.   Getting enough exercise and proper rest.   Do not smoke.   Breastfeed your baby if you can. This will lower the chance of  you and your baby developing diabetes later in life.  For more information about diabetes, go to the American Diabetes Association at: PMFashions.com.cy. For more information about gestational diabetes, go to the [employer]st[employer]ians and Gynecologists at: RentRule.com.au. Document Released: 06/17/2000 Document Revised: 02/28/2011 Document Reviewed: 01/09/2009 [facility] Patient Information 2012 [place], [place].

## 2011-11-11 NOTE — Progress Notes (Signed)
Pulse: 93

## 2011-11-11 NOTE — Progress Notes (Signed)
GDM:  Diet controlled - poorly controlled. Fastings 105-124, PP 103-156, majority over 120. Will start glyburide 5 mg BID. Twice weekly NSTs, first today.  [place] for growth to be scheduled today.  Obesity, wt gain 45 lb to date. Discussed diet. F/U 1 week.

## 2011-11-15 ENCOUNTER — Ambulatory Visit (HOSPITAL_COMMUNITY)
Admission: RE | Admit: 2011-11-15 | Discharge: 2011-11-15 | Disposition: A | Discharge status: Home / Self Care | Payer: Self-pay | Source: Ambulatory Visit | Attending: Family Medicine | Admitting: Family Medicine

## 2011-11-15 ENCOUNTER — Ambulatory Visit (INDEPENDENT_AMBULATORY_CARE_PROVIDER_SITE_OTHER): Payer: Self-pay | Admitting: *Deleted

## 2011-11-15 VITALS — BP 109/55 | Wt 263.4 lb

## 2011-11-15 DIAGNOSIS — O24419 Gestational diabetes mellitus in pregnancy, unspecified control: Secondary | ICD-10-CM

## 2011-11-15 DIAGNOSIS — O99810 Abnormal glucose complicating pregnancy: Secondary | ICD-10-CM | POA: Insufficient documentation

## 2011-11-15 DIAGNOSIS — E669 Obesity, unspecified: Secondary | ICD-10-CM | POA: Insufficient documentation

## 2011-11-15 DIAGNOSIS — O09529 Supervision of elderly multigravida, unspecified trimester: Secondary | ICD-10-CM | POA: Insufficient documentation

## 2011-11-15 DIAGNOSIS — O99210 Obesity complicating pregnancy, unspecified trimester: Secondary | ICD-10-CM | POA: Insufficient documentation

## 2011-11-15 NOTE — Progress Notes (Signed)
Reactive NST  Christie Holder, M.D., FACOG  

## 2011-11-15 NOTE — Progress Notes (Signed)
P = 93   [place] growth today

## 2011-11-18 ENCOUNTER — Other Ambulatory Visit: Payer: Self-pay

## 2011-11-19 ENCOUNTER — Ambulatory Visit (INDEPENDENT_AMBULATORY_CARE_PROVIDER_SITE_OTHER): Payer: Self-pay | Admitting: *Deleted

## 2011-11-19 VITALS — BP 113/58 | Wt 262.2 lb

## 2011-11-19 DIAGNOSIS — O99810 Abnormal glucose complicating pregnancy: Secondary | ICD-10-CM

## 2011-11-19 DIAGNOSIS — O24419 Gestational diabetes mellitus in pregnancy, unspecified control: Secondary | ICD-10-CM

## 2011-11-19 NOTE — Progress Notes (Signed)
NST reviewed and reactive.  Rachel Samples L. Harraway-Smith, M.D., FACOG    

## 2011-11-19 NOTE — Progress Notes (Signed)
P-90 

## 2011-11-22 ENCOUNTER — Ambulatory Visit (INDEPENDENT_AMBULATORY_CARE_PROVIDER_SITE_OTHER): Payer: Self-pay | Admitting: *Deleted

## 2011-11-22 VITALS — BP 114/61 | Wt 264.8 lb

## 2011-11-22 DIAGNOSIS — O24419 Gestational diabetes mellitus in pregnancy, unspecified control: Secondary | ICD-10-CM

## 2011-11-22 DIAGNOSIS — O99810 Abnormal glucose complicating pregnancy: Secondary | ICD-10-CM

## 2011-11-22 NOTE — Progress Notes (Signed)
P = 91 

## 2011-11-24 NOTE — Progress Notes (Signed)
NST reviewed and reactive.  

## 2011-11-26 ENCOUNTER — Ambulatory Visit (INDEPENDENT_AMBULATORY_CARE_PROVIDER_SITE_OTHER): Payer: Self-pay | Admitting: *Deleted

## 2011-11-26 VITALS — BP 111/62 | Wt 267.8 lb

## 2011-11-26 DIAGNOSIS — O24419 Gestational diabetes mellitus in pregnancy, unspecified control: Secondary | ICD-10-CM

## 2011-11-26 DIAGNOSIS — O99810 Abnormal glucose complicating pregnancy: Secondary | ICD-10-CM

## 2011-11-26 NOTE — Progress Notes (Signed)
NST reviewed and reactive.  Ramesh Moan L. Harraway-Smith, M.D., FACOG    

## 2011-11-26 NOTE — Progress Notes (Signed)
P-109 

## 2011-11-27 ENCOUNTER — Telehealth: Payer: Self-pay | Admitting: Medical

## 2011-11-27 NOTE — Telephone Encounter (Signed)
Patient called back to the front office. Christie Holder gave me the phone. I advised the patient to do to MAU for evaluation because her symptoms were concerning. The patient voiced understanding and plans to go to MAU.

## 2011-11-27 NOTE — Telephone Encounter (Signed)
Patient called stating that she needed to speak with a nurse or doctor. She is having swelling in both LE and loss of sensation in the left leg up to the hip.

## 2011-11-27 NOTE — Telephone Encounter (Signed)
LM for patient to return call to clinic ASAP at both contact numbers listed.

## 2011-11-29 ENCOUNTER — Ambulatory Visit (INDEPENDENT_AMBULATORY_CARE_PROVIDER_SITE_OTHER): Payer: Self-pay | Admitting: *Deleted

## 2011-11-29 VITALS — BP 113/67 | Wt 268.8 lb

## 2011-11-29 DIAGNOSIS — O99810 Abnormal glucose complicating pregnancy: Secondary | ICD-10-CM

## 2011-11-29 DIAGNOSIS — O24419 Gestational diabetes mellitus in pregnancy, unspecified control: Secondary | ICD-10-CM

## 2011-11-29 NOTE — Progress Notes (Signed)
P = 92 

## 2011-12-02 ENCOUNTER — Ambulatory Visit (INDEPENDENT_AMBULATORY_CARE_PROVIDER_SITE_OTHER): Payer: Self-pay | Admitting: Obstetrics & Gynecology

## 2011-12-02 VITALS — BP 120/66 | Temp 97.1°F | Wt 268.0 lb

## 2011-12-02 DIAGNOSIS — O99810 Abnormal glucose complicating pregnancy: Secondary | ICD-10-CM

## 2011-12-02 DIAGNOSIS — O09299 Supervision of pregnancy with other poor reproductive or obstetric history, unspecified trimester: Secondary | ICD-10-CM | POA: Insufficient documentation

## 2011-12-02 DIAGNOSIS — O24419 Gestational diabetes mellitus in pregnancy, unspecified control: Secondary | ICD-10-CM

## 2011-12-02 DIAGNOSIS — O0990 Supervision of high risk pregnancy, unspecified, unspecified trimester: Secondary | ICD-10-CM

## 2011-12-02 LAB — POCT URINALYSIS DIP (DEVICE)
Bilirubin Urine: NEGATIVE
Leukocytes, UA: NEGATIVE
Nitrite: NEGATIVE
Urobilinogen, UA: 0.2 mg/dL (ref 0.0–1.0)
pH: 7 (ref 5.0–8.0)

## 2011-12-02 LAB — OB RESULTS CONSOLE GBS: GBS: NEGATIVE

## 2011-12-02 NOTE — Progress Notes (Signed)
Hasn't been checking CBGs (ran out of strips).  Has been taking medications.  Random CBG (2 1/2 hours after eating is 143).  >90% on last grwoth.  Has delivered 10 pound baby at [facility] with "no problems".  Will get records to ensure there was no shoulder dystocia.  NST reactive today.

## 2011-12-02 NOTE — Progress Notes (Signed)
Pulse 106.   Edema trace in face, hands, feet. C/o pain in BLE. Pressure in pelvic.

## 2011-12-03 LAB — GC/CHLAMYDIA PROBE AMP, URINE: Chlamydia, Swab/Urine, PCR: NEGATIVE

## 2011-12-04 ENCOUNTER — Encounter: Payer: Self-pay | Admitting: *Deleted

## 2011-12-04 DIAGNOSIS — O2300 Infections of kidney in pregnancy, unspecified trimester: Secondary | ICD-10-CM

## 2011-12-04 DIAGNOSIS — O24419 Gestational diabetes mellitus in pregnancy, unspecified control: Secondary | ICD-10-CM

## 2011-12-06 ENCOUNTER — Encounter (HOSPITAL_COMMUNITY): Payer: Self-pay | Admitting: *Deleted

## 2011-12-06 ENCOUNTER — Inpatient Hospital Stay (HOSPITAL_COMMUNITY)
Admission: AD | Admit: 2011-12-06 | Discharge: 2011-12-06 | Disposition: A | Discharge status: Home / Self Care | Payer: Self-pay | Source: Ambulatory Visit | Attending: Obstetrics & Gynecology | Admitting: Obstetrics & Gynecology

## 2011-12-06 ENCOUNTER — Ambulatory Visit (INDEPENDENT_AMBULATORY_CARE_PROVIDER_SITE_OTHER): Payer: Self-pay | Admitting: *Deleted

## 2011-12-06 DIAGNOSIS — O24419 Gestational diabetes mellitus in pregnancy, unspecified control: Secondary | ICD-10-CM

## 2011-12-06 DIAGNOSIS — O99810 Abnormal glucose complicating pregnancy: Secondary | ICD-10-CM

## 2011-12-06 DIAGNOSIS — H9209 Otalgia, unspecified ear: Secondary | ICD-10-CM | POA: Insufficient documentation

## 2011-12-06 DIAGNOSIS — O2300 Infections of kidney in pregnancy, unspecified trimester: Secondary | ICD-10-CM

## 2011-12-06 DIAGNOSIS — O99891 Other specified diseases and conditions complicating pregnancy: Secondary | ICD-10-CM | POA: Insufficient documentation

## 2011-12-06 DIAGNOSIS — O0990 Supervision of high risk pregnancy, unspecified, unspecified trimester: Secondary | ICD-10-CM

## 2011-12-06 MED ORDER — NEOMYCIN-POLYMYXIN-HC 3.5-10000-1 OT SOLN: 3.0000 [drp] | Freq: Four times a day (QID) | OTIC | Status: AC

## 2011-12-06 NOTE — MAU Provider Note (Signed)
History     CSN: [id number]  Arrival date and time: 12/06/11 1229   None     Chief Complaint  Patient presents with  . Otalgia   HPI Comments: Patient presents after NST due to ear pain x5 days.  Did note a little drainage on the 1st day of pain, but none since.  No fevers or chills, no rhinorrhea, ST, cough.  Does feel like she has some decreased hearing out of left ear.  Right ear feels fine. Pain is constant, worse when she chews or if she moves her ear.   Otalgia  There is pain in the left ear. This is a new problem. The current episode started in the past 7 days. The problem occurs constantly. The problem has been gradually worsening. There has been no fever. The pain is moderate. Associated symptoms include diarrhea, ear discharge and hearing loss. Pertinent negatives include no abdominal pain, coughing, headaches, rash, rhinorrhea, sore throat or vomiting. She has tried nothing for the symptoms. There is no history of a chronic ear infection or hearing loss.    OB History    Grav Para Term Preterm Abortions TAB SAB Ect Mult Living   8 6 6  1  1   6       Past Medical History  Diagnosis Date  . Gestational diabetes 10/10/2011    Past Surgical History  Procedure Date  . No past surgeries     Family History  Problem Relation Age of Onset  . Diabetes Maternal Grandmother     History  Substance Use Topics  . Smoking status: Never Smoker   . Smokeless tobacco: Never Used  . Alcohol Use: No    Allergies: No Known Allergies  Prescriptions prior to admission  Medication Sig Dispense Refill  . acetaminophen (TYLENOL) 500 MG tablet Take 1,000 mg by mouth every 6 (six) hours as needed. For pain      . glyBURIDE (DIABETA) 2.5 MG tablet Take 2 tablets (5 mg total) by mouth 2 (two) times daily with a meal.  60 tablet  3  . Prenatal Vit-Fe Fumarate-FA (PRENATAL MULTIVITAMIN) TABS Take 1 tablet by mouth daily.        Review of Systems  Constitutional: Negative for fever  and chills.  HENT: Positive for hearing loss, ear pain and ear discharge. Negative for congestion, sore throat, rhinorrhea and tinnitus.   Eyes: Negative for discharge and redness.  Respiratory: Negative for cough and shortness of breath.   Gastrointestinal: Positive for diarrhea. Negative for nausea, vomiting and abdominal pain.  Skin: Negative for rash.  Neurological: Negative for dizziness and headaches.   Physical Exam   Blood pressure 114/65, pulse 80, temperature 97.9 F (36.6 C), temperature source Oral, resp. rate 18, height 5\' 2"  (1.575 m), weight 123.923 kg (273 lb 3.2 oz), last menstrual period 03/21/2011, SpO2 97.00%.  Physical Exam  Constitutional: She is oriented to person, place, and time. She appears well-developed and well-nourished. No distress.  HENT:  Head: Normocephalic and atraumatic.  Right Ear: External ear and ear canal normal.  Left Ear: There is tenderness.  Nose: Nose normal.  Mouth/Throat: Oropharynx is clear and moist. No oropharyngeal exudate.       Cerumen impaction at the TM bilaterally, unable to visualize R or L TM; Left canal with erythema, some skin sloughing, no obvious drainage   Eyes: Conjunctivae normal and EOM are normal. Pupils are equal, round, and reactive to light.  Neck: Normal range of motion. Neck supple.  Cardiovascular: Normal rate, regular rhythm, normal heart sounds and intact distal pulses.   No murmur heard. Respiratory: Effort normal and breath sounds normal. No respiratory distress. She has no wheezes.  GI:       gravid  Musculoskeletal: She exhibits no edema.  Lymphadenopathy:    She has no cervical adenopathy.  Neurological: She is alert and oriented to person, place, and time.  Skin: Skin is warm.    MAU Course  Procedures  MDM Based on exam, most likely otitis externa.  Do not have tools necessary to clean out cerum to insure no otitis media, however, pt very tender with just otoscope so likely would not tolerate a  clean out procedure. Treat with cortisporin otic drops. If worsens, have fevers, or does not improve within a few days, pt instructed to return.   Assessment and Plan  41 yo G8P6016 at 37.1 p/w 5 day history of L ear pain found to have otitis externa  Treat with cortisporin otic drops. If worsens, have fevers, or does not improve within a few days, pt instructed to return.   Christie Holder 12/06/2011, 1:08 PM

## 2011-12-06 NOTE — Progress Notes (Signed)
NST reactive today.

## 2011-12-06 NOTE — MAU Note (Signed)
Patient states she started having pain in the left ear on 9-9 and now radiating down neck.Denies any contractions, bleeding or leaking and reports good fetal movement.

## 2011-12-06 NOTE — MAU Provider Note (Signed)
Seen and agree with note Braniyah Besse CNM 

## 2011-12-09 ENCOUNTER — Telehealth (HOSPITAL_COMMUNITY): Payer: Self-pay | Admitting: *Deleted

## 2011-12-09 ENCOUNTER — Ambulatory Visit (INDEPENDENT_AMBULATORY_CARE_PROVIDER_SITE_OTHER): Payer: Self-pay | Admitting: Obstetrics and Gynecology

## 2011-12-09 VITALS — BP 125/71 | Temp 97.0°F | Wt 270.3 lb

## 2011-12-09 DIAGNOSIS — O24419 Gestational diabetes mellitus in pregnancy, unspecified control: Secondary | ICD-10-CM

## 2011-12-09 DIAGNOSIS — O99810 Abnormal glucose complicating pregnancy: Secondary | ICD-10-CM

## 2011-12-09 DIAGNOSIS — O0990 Supervision of high risk pregnancy, unspecified, unspecified trimester: Secondary | ICD-10-CM

## 2011-12-09 DIAGNOSIS — O09299 Supervision of pregnancy with other poor reproductive or obstetric history, unspecified trimester: Secondary | ICD-10-CM

## 2011-12-09 DIAGNOSIS — E669 Obesity, unspecified: Secondary | ICD-10-CM

## 2011-12-09 LAB — POCT URINALYSIS DIP (DEVICE)
Bilirubin Urine: NEGATIVE
Ketones, ur: NEGATIVE mg/dL
Leukocytes, UA: NEGATIVE
Specific Gravity, Urine: 1.02 (ref 1.005–1.030)

## 2011-12-09 LAB — GLUCOSE, CAPILLARY: Glucose-Capillary: 90 mg/dL (ref 70–99)

## 2011-12-09 NOTE — Progress Notes (Signed)
P = 89 Mild-moderate edema in feet, hands and face Pain and pressure in lower abdomen

## 2011-12-09 NOTE — Telephone Encounter (Signed)
Preadmission screen [phone]

## 2011-12-09 NOTE — Progress Notes (Signed)
NST reviewed and category I. Patient did not bring CBG log again today. Patient scheduled for F/u growth ultrasound next week. [facility] discharge summary describes a mild shoulder dystocia at the time of her last delivery. Informed patient of increased risk of shoulder dystocia with this pregnancy as well. Offered primary cesarean section at 39 weeks. Risks, benefits of vagianl birth vs primary c-section explained to the patient. Patient desires a vaginal birth. Patient is awaiting results of ultrasound before making final decision. FM/labor precautions reviewed Random CBG- 90

## 2011-12-09 NOTE — Progress Notes (Signed)
[place] growth scheduled on 11/15/11

## 2011-12-12 NOTE — Progress Notes (Signed)
NST reactive on 11/29/11

## 2011-12-13 ENCOUNTER — Ambulatory Visit (INDEPENDENT_AMBULATORY_CARE_PROVIDER_SITE_OTHER): Payer: Self-pay | Admitting: *Deleted

## 2011-12-13 ENCOUNTER — Other Ambulatory Visit: Payer: Self-pay

## 2011-12-13 VITALS — BP 128/64 | Wt 271.4 lb

## 2011-12-13 DIAGNOSIS — O99810 Abnormal glucose complicating pregnancy: Secondary | ICD-10-CM

## 2011-12-13 NOTE — Progress Notes (Signed)
P-96 

## 2011-12-16 ENCOUNTER — Encounter: Payer: Self-pay | Admitting: Obstetrics & Gynecology

## 2011-12-16 ENCOUNTER — Ambulatory Visit (HOSPITAL_COMMUNITY)
Admission: RE | Admit: 2011-12-16 | Discharge: 2011-12-16 | Disposition: A | Discharge status: Home / Self Care | Payer: Self-pay | Source: Ambulatory Visit | Attending: Obstetrics & Gynecology | Admitting: Obstetrics & Gynecology

## 2011-12-16 ENCOUNTER — Ambulatory Visit (INDEPENDENT_AMBULATORY_CARE_PROVIDER_SITE_OTHER): Payer: Self-pay | Admitting: Obstetrics & Gynecology

## 2011-12-16 VITALS — BP 114/73 | Temp 97.0°F | Wt 271.5 lb

## 2011-12-16 DIAGNOSIS — O0990 Supervision of high risk pregnancy, unspecified, unspecified trimester: Secondary | ICD-10-CM

## 2011-12-16 DIAGNOSIS — O99810 Abnormal glucose complicating pregnancy: Secondary | ICD-10-CM

## 2011-12-16 DIAGNOSIS — O3660X0 Maternal care for excessive fetal growth, unspecified trimester, not applicable or unspecified: Secondary | ICD-10-CM | POA: Insufficient documentation

## 2011-12-16 DIAGNOSIS — E669 Obesity, unspecified: Secondary | ICD-10-CM

## 2011-12-16 DIAGNOSIS — O24419 Gestational diabetes mellitus in pregnancy, unspecified control: Secondary | ICD-10-CM

## 2011-12-16 DIAGNOSIS — O09529 Supervision of elderly multigravida, unspecified trimester: Secondary | ICD-10-CM | POA: Insufficient documentation

## 2011-12-16 DIAGNOSIS — O09299 Supervision of pregnancy with other poor reproductive or obstetric history, unspecified trimester: Secondary | ICD-10-CM

## 2011-12-16 DIAGNOSIS — Z23 Encounter for immunization: Secondary | ICD-10-CM

## 2011-12-16 LAB — POCT URINALYSIS DIP (DEVICE)
Bilirubin Urine: NEGATIVE
Glucose, UA: NEGATIVE mg/dL
Ketones, ur: NEGATIVE mg/dL
Leukocytes, UA: NEGATIVE
Nitrite: NEGATIVE

## 2011-12-16 LAB — GLUCOSE, CAPILLARY: Glucose-Capillary: 98 mg/dL (ref 70–99)

## 2011-12-16 MED ORDER — INFLUENZA VIRUS VACC SPLIT PF IM SUSP
0.5000 mL | Freq: Once | INTRAMUSCULAR | Status: AC
  Administered 2011-12-16: 0.5 mL via INTRAMUSCULAR

## 2011-12-16 NOTE — Progress Notes (Signed)
NST performed today was reviewed and was found to be reactive.  Continue recommended antenatal testing and prenatal care.  

## 2011-12-16 NOTE — Progress Notes (Signed)
Pulse: 96 Would like flu shot today, consent signed

## 2011-12-16 NOTE — Patient Instructions (Addendum)
10 lbs 11 oz  4841g  Distocia de hombros (Shoulder Dystocia) La distocia de hombros es un problema que puede ocurrir durante un parto vaginal. Luego de salir la cabeza del beb, los hombros se atascan en la abertura de la vagina y el hueso pbico. Este puede ser un problema grave. Ocurre entre el 0,6% y el 1,6% de los partos. Esto es Radio broadcast assistant. Desafortunadamente, el mdico no puede predecir quin tendr [employer] antes del parto. Por lo tanto no hay forma de evitarlo. Cuando se produce la distocia de hombros, la madre y el beb tienen riesgo de sufrir una lesin o complicacin. El [place] de parto no debe inducirse (forzarse a Games developer) si se sospecha una distocia de hombros. Los signos durante el [place] de parto son:  Movimiento muy lento del beb al descender por el canal de parto.   "Signo de la tortuga." La cabeza del beb se [employer] adentro y [place] afuera de la apertura de la vagina, durante las contracciones.  La distocia de hombros puede causar lesiones graves al beb, por ejemplo:  Fractura de la clavcula.   Lesin del [employer] zona del hombro y el brazo (parlisis de Erb).   Lesin en los nervios del cuello y el hombro del beb (lesin en el plexo braquial).   Disminucin del nivel de oxgeno que da como resultado una lesin cerebral, y la muerte en algunos casos.   Discapacidad muscular y trastornos del habla, como resultado de una lesin cerebral (parlisis cerebral).  Las lesiones en la madre pueden incluir:  Desgarro grave Teaching laboratory technician) de la vagina y el recto.   Laceracin del cuello del tero.   Hemorragia (sangrado abundante).   Infecciones.   Separacin del hueso pbico, con lesin de los nervios de la zona.   Imposibilidad de contener y Scientist, physiological materia fecal.  Las mujeres embarazadas que tienen ms probabilidad de tener este problema son:  Felicity Coyer diabticas.   Las que tienen una pelvis muy pequea.   Mujeres obesas.   Las  mujeres con la pelvis deformada debido a un accidente o por un problema congnito.   La ecografa muestra a un beb macrosmico (su peso es de 8.5 libras (3.8 Kg.) o ms).  Los estudios han demostrado que no es necesaria la operacin cesrea cuando:   Slo se sospecha que la madre pueda tener este problema.   La madre ya tuvo un hijo con distocia de hombro.  La cesrea se indicar cuando la madre ya ha tenido un nio que:  Era ms pequeo que el que est gestando actualmente.   Tuvo lesiones neurolgicas u otras lesiones graves.  Hay ciertas tcnicas en las que el profesional ha sido entrenado para Chemical engineer en [employer]. Las tcnicas ayudarn a que el parto sea seguro para el beb. Los cuidados en el postparto luego de una distocia de hombros no son diferentes de cualquier otro parto vaginal, excepto que haya lesiones o complicaciones. Document Released: 08/28/2007 Document Revised: 02/28/2011 [facility] Patient Information 2012 [place], [place].  Parto por cesrea  (Cesarean Delivery) El parto por cesrea es el nacimiento de un feto a travs de un corte (incisin) en el vientre (abdomen) y en el matriz (tero).  HGALE SABER A SU MDICO SOBRE:   Complicaciones del embarazo.   Alergias.   Medicamentos que [place], incluyendo hierbas, gotas oftlmicas, medicamentos de [place] y Control and instrumentation engineer.   Uso de corticoides (por va oral o cremas).   Problemas anteriores debido a anestsicos o  a medicamentos que disminuyen la sensibilidad.   Cirugas anteriores.   Historia de cogulos sanguneos   Problemas hemorrgicos o en la sangre.   Otros problemas de [place].  RIESGOS Y COMPLICACIONES  Hemorragias.   Infeccin.   Cogulos sanguneos.   Lesin en los rganos circundantes.   Problemas con la anestesia.   Lesiones al beb.  ANTES DEL PROCEDIMIENTO   Le insertarn un tubo (catter [employer] vejiga. El catter Foley drena la orina desde la vejiga hacia [place]. Esto mantendr  la vejiga vaca durante la [place].   Le colocarn un catter de acceso intravenoso (IV) en el brazo.   Luego rasurarn la zona del pubis y la parte inferior del abdomen. Esto se realiza para evitar una infeccin en el sitio de la incisin.   Le administrarn un medicamento anticido. Esto impedir que los contenidos cidos del estmago ingresen a los pulmones si vomita durante el procedimiento.   Le podrn dar antibiticos para prevenir infecciones.  PROCEDIMIENTO   Le administrarn un medicamento para adormecer a zona inferior del cuerpo anestesia regional). Si estuviera en trabajo de parto, podrn aplicarle una anestesia epidural, que se utiliza tanto el el [place] de parto como en la cesrea. Puede ser [employer] administren un medicamento que la har dormir (anestesia general), aunque esto no es tan frecuente.   Le harn una incisin en el abdomen que se extiende [employer] and [employer]. Hay dos tipos bsicos de incisin:   La incisin horizontal (transversa) Las incisiones horizontales se Engineer, maintenance mayor parte de las cesreas de [place].   La incisin vertical (de Jolene Schimke). Esta es la menos frecuente. Se reserva para aquellas mujeres que tienen una complicacin grave (parto prematuro extremo) o bajo situaciones de Associate Professor.   Las incisiones horizontales y Garment/textile technologist pueden utilizarse ambas al [employer]. Sin embargo, no es Air traffic controller.   El beb nacer.  DESPUS DEL PROCEDIMIENTO   Si estuvo despierta durante la [place], podr ver al beb enseguida. Si la duermen, ver al beb tan pronto como despierte.   Podr amamantar a su beb despus del procedimiento.   Podr levantarse y Therapist, art mismo da de la [place]. Si Office manager en cama durante cierto tiempo, recibir ayuda para darse vuelta, toser y Industrial/product designer profundamente despus de la ciruga. Esto ayuda a Environmental health practitioner en los pulmones, como la neumona.   No se levante de la cama sola la primera vez luego  de la [place]. Necesitar ayuda para levantarse de la cama hasta que pueda hacerlo sola.   Podr darse Shaune Spittle el da siguiente a la [place]. Despus que le quiten el vendaje, un enfermero la ayudar a ducharse.   Tendr unas medias compresivas neumticas en los pies o en la zona inferior de las piernas. Se colocan para prevenir cogulos sanguneos. Cuando se levante y camine regularmente, ya no sern necesarias.   Nocruce las piernas al sentarse.   Si elimina cogulos de sangre, gurdelos. Si elimina un cogulo cuando va al bao, por favor no tire la cadena. Llame al enfermero. Comunquele al enfermero si piensa que tiene demasiada hemorragia o que elimina muchos cogulos.   Comience a beber lquidos y a alimentarse segn las indicaciones del mdico. Si el estmago no est en condiciones,comer y beber demasiado pronto puede causar aumento de la hinchazn y la inflamacin del intestino o el abdomen. Esto es [employer].   Le darn medicamentos si los necesita. Dgale a los profesionales si siente dolor. Ellos tratarn  de que se sienta cmoda. Tambin le indicarn antibiticos para prevenir una infeccin.   Le quitarn la va intravenosa cuando beba una cantidad razonable de lquido. El catter Foley se retirar cuando se levante y camine.   Si su tipo sanguneo es Rh negativo y el beb es Rh positivo, le darn una inyeccin de inmunoblobulina anti D. Esta inyeccin evita que tenga problemas con el Rh en embarazos futuros. Deber colocarse la inyeccin an si se ha Production assistant, radio las trompas.   Si le permiten llevar al beb a dar un paseo,colquelo en la cunita y empjela. No lleve al beb en sus brazos.  Document Released: 03/11/2005 Document Revised: 02/28/2011 [facility] Patient Information 2012 [place], [place].

## 2011-12-16 NOTE — Progress Notes (Signed)
[place] growth today- EFW  10lb 11oz per verbal report. Pt states she has had 4 vaginal deliveries w/baby >10 lb, largest baby weighed 11 lb and she wants to avoid cesarean section.  Patient counseled about cesarean section due to increased risk of shoulder dystocia. Discussed maternal and fetal risks of shoulder dystocia and emphasized risks of temporary or permanent nerve damage, fetal fractures, hypoxia which could cause encephalopathy or other problems, fetal death, increased morbidity for patient.  Also emphasized that if she declines cesarean section, she may be asked to sign a refusal form when she comes for her scheduled IOL on 12/19/11 0730. All questions answered, she was given patient educational materials about shoulder dystocia and cesarean section to review at home. Patient said she will think about this and call back tomorrow or express her decision when she comes for her IOL. She was told not to eat anything after midnight prior to her induction.  Unfavorable cervix on exam.  NST performed today was reviewed and was found to be reactive.  Patient forgot log book, random CBG 98.  No other complaints or concerns.  Fetal movement and labor precautions reviewed. Entire encounter and counseling done with the help of a Spanish interpreter.

## 2011-12-19 ENCOUNTER — Encounter (HOSPITAL_COMMUNITY): Payer: Self-pay | Admitting: General Surgery

## 2011-12-19 ENCOUNTER — Encounter (HOSPITAL_COMMUNITY)
Admission: RE | Disposition: A | Discharge status: Home / Self Care | Payer: Self-pay | Source: Ambulatory Visit | Attending: Obstetrics & Gynecology

## 2011-12-19 ENCOUNTER — Telehealth (HOSPITAL_COMMUNITY): Payer: Self-pay | Admitting: *Deleted

## 2011-12-19 ENCOUNTER — Inpatient Hospital Stay (HOSPITAL_COMMUNITY)
Admission: RE | Admit: 2011-12-19 | Discharge: 2011-12-21 | DRG: 765 | Disposition: A | Discharge status: Home / Self Care | Payer: Medicaid Other | Source: Ambulatory Visit | Attending: Obstetrics & Gynecology | Admitting: Obstetrics & Gynecology

## 2011-12-19 ENCOUNTER — Encounter (HOSPITAL_COMMUNITY): Payer: Self-pay

## 2011-12-19 ENCOUNTER — Encounter (HOSPITAL_COMMUNITY): Payer: Self-pay | Admitting: Pharmacist

## 2011-12-19 ENCOUNTER — Inpatient Hospital Stay (HOSPITAL_COMMUNITY): Admission: RE | Admit: 2011-12-19 | Payer: Self-pay | Source: Ambulatory Visit

## 2011-12-19 ENCOUNTER — Inpatient Hospital Stay (HOSPITAL_COMMUNITY): Payer: Medicaid Other

## 2011-12-19 ENCOUNTER — Encounter (HOSPITAL_COMMUNITY): Payer: Self-pay | Admitting: Anesthesiology

## 2011-12-19 ENCOUNTER — Telehealth (HOSPITAL_COMMUNITY): Payer: Self-pay | Admitting: Obstetrics & Gynecology

## 2011-12-19 DIAGNOSIS — E119 Type 2 diabetes mellitus without complications: Secondary | ICD-10-CM | POA: Diagnosis present

## 2011-12-19 DIAGNOSIS — O2432 Unspecified pre-existing diabetes mellitus in childbirth: Secondary | ICD-10-CM

## 2011-12-19 DIAGNOSIS — O0990 Supervision of high risk pregnancy, unspecified, unspecified trimester: Secondary | ICD-10-CM

## 2011-12-19 DIAGNOSIS — O3660X0 Maternal care for excessive fetal growth, unspecified trimester, not applicable or unspecified: Secondary | ICD-10-CM

## 2011-12-19 DIAGNOSIS — O321XX0 Maternal care for breech presentation, not applicable or unspecified: Secondary | ICD-10-CM

## 2011-12-19 DIAGNOSIS — O09529 Supervision of elderly multigravida, unspecified trimester: Secondary | ICD-10-CM | POA: Diagnosis present

## 2011-12-19 DIAGNOSIS — O34219 Maternal care for unspecified type scar from previous cesarean delivery: Secondary | ICD-10-CM | POA: Diagnosis present

## 2011-12-19 LAB — RPR: RPR Ser Ql: NONREACTIVE

## 2011-12-19 LAB — CBC
HCT: 35 % — ABNORMAL LOW (ref 36.0–46.0)
MCHC: 34.6 g/dL (ref 30.0–36.0)
MCV: 97 fL (ref 78.0–100.0)
RDW: 13.3 % (ref 11.5–15.5)

## 2011-12-19 LAB — GLUCOSE, CAPILLARY
Glucose-Capillary: 97 mg/dL (ref 70–99)
Glucose-Capillary: 89 mg/dL (ref 70–99)

## 2011-12-19 LAB — PREPARE RBC (CROSSMATCH)

## 2011-12-19 LAB — SURGICAL PCR SCREEN: Staphylococcus aureus: POSITIVE — AB

## 2011-12-19 LAB — RAPID HIV SCREEN (WH-MAU): Rapid HIV Screen: NONREACTIVE

## 2011-12-19 SURGERY — Surgical Case
Anesthesia: Spinal | Site: Abdomen | Wound class: Clean Contaminated

## 2011-12-19 MED ORDER — MISOPROSTOL 100 MCG PO TABS
ORAL_TABLET | ORAL | Status: DC | PRN
  Administered 2011-12-19: 800 ug via RECTAL

## 2011-12-19 MED ORDER — NEOMYCIN-POLYMYXIN-HC 3.5-10000-1 OT SOLN
3.0000 [drp] | Freq: Four times a day (QID) | OTIC | Status: DC
  Administered 2011-12-19 – 2011-12-20 (×6): 3 [drp] via OTIC

## 2011-12-19 MED ORDER — LACTATED RINGERS IV SOLN
INTRAVENOUS | Status: DC
  Administered 2011-12-19: 14:00:00 via INTRAVENOUS

## 2011-12-19 MED ORDER — DIPHENHYDRAMINE HCL 25 MG PO CAPS: 25.0000 mg | ORAL_CAPSULE | ORAL | Status: DC | PRN

## 2011-12-19 MED ORDER — SIMETHICONE 80 MG PO CHEW
80.0000 mg | CHEWABLE_TABLET | Freq: Three times a day (TID) | ORAL | Status: DC
  Administered 2011-12-19 – 2011-12-21 (×5): 80 mg via ORAL

## 2011-12-19 MED ORDER — HYDROMORPHONE HCL PF 1 MG/ML IJ SOLN: 0.2500 mg | INTRAMUSCULAR | Status: DC | PRN

## 2011-12-19 MED ORDER — KETOROLAC TROMETHAMINE 60 MG/2ML IM SOLN
INTRAMUSCULAR | Status: AC
  Administered 2011-12-19: 60 mg via INTRAMUSCULAR
  Filled 2011-12-19: qty 2

## 2011-12-19 MED ORDER — OXYTOCIN 10 UNIT/ML IJ SOLN
INTRAMUSCULAR | Status: AC
  Filled 2011-12-19: qty 4

## 2011-12-19 MED ORDER — PHENYLEPHRINE 40 MCG/ML (10ML) SYRINGE FOR IV PUSH (FOR BLOOD PRESSURE SUPPORT)
PREFILLED_SYRINGE | INTRAVENOUS | Status: AC
  Filled 2011-12-19: qty 5

## 2011-12-19 MED ORDER — SCOPOLAMINE 1 MG/3DAYS TD PT72
1.0000 | MEDICATED_PATCH | Freq: Once | TRANSDERMAL | Status: DC
  Administered 2011-12-19: 1.5 mg via TRANSDERMAL

## 2011-12-19 MED ORDER — IBUPROFEN 600 MG PO TABS
600.0000 mg | ORAL_TABLET | Freq: Four times a day (QID) | ORAL | Status: DC
  Administered 2011-12-20 – 2011-12-21 (×7): 600 mg via ORAL
  Filled 2011-12-19 (×7): qty 1

## 2011-12-19 MED ORDER — MORPHINE SULFATE 0.5 MG/ML IJ SOLN
INTRAMUSCULAR | Status: AC
  Filled 2011-12-19: qty 10

## 2011-12-19 MED ORDER — KETOROLAC TROMETHAMINE 60 MG/2ML IM SOLN
60.0000 mg | Freq: Once | INTRAMUSCULAR | Status: AC | PRN
  Administered 2011-12-19: 60 mg via INTRAMUSCULAR

## 2011-12-19 MED ORDER — MEPERIDINE HCL 25 MG/ML IJ SOLN: 6.2500 mg | INTRAMUSCULAR | Status: DC | PRN

## 2011-12-19 MED ORDER — SODIUM CHLORIDE 0.9 % IJ SOLN: 3.0000 mL | INTRAMUSCULAR | Status: DC | PRN

## 2011-12-19 MED ORDER — CEFAZOLIN SODIUM 10 G IJ SOLR
3.0000 g | INTRAMUSCULAR | Status: AC
  Administered 2011-12-19 (×2): 3 g via INTRAVENOUS
  Filled 2011-12-19: qty 3000

## 2011-12-19 MED ORDER — MENTHOL 3 MG MT LOZG: 1.0000 | LOZENGE | OROMUCOSAL | Status: DC | PRN

## 2011-12-19 MED ORDER — ONDANSETRON HCL 4 MG/2ML IJ SOLN
INTRAMUSCULAR | Status: DC | PRN
  Administered 2011-12-19: 4 mg via INTRAVENOUS

## 2011-12-19 MED ORDER — KETOROLAC TROMETHAMINE 30 MG/ML IJ SOLN: 30.0000 mg | Freq: Four times a day (QID) | INTRAMUSCULAR | Status: AC | PRN

## 2011-12-19 MED ORDER — SCOPOLAMINE 1 MG/3DAYS TD PT72: 1.0000 | MEDICATED_PATCH | Freq: Once | TRANSDERMAL | Status: DC

## 2011-12-19 MED ORDER — FENTANYL CITRATE 0.05 MG/ML IJ SOLN
INTRAMUSCULAR | Status: DC | PRN
  Administered 2011-12-19: 15 ug via INTRATHECAL

## 2011-12-19 MED ORDER — NALBUPHINE HCL 10 MG/ML IJ SOLN: 5.0000 mg | INTRAMUSCULAR | Status: DC | PRN

## 2011-12-19 MED ORDER — FENTANYL CITRATE 0.05 MG/ML IJ SOLN
INTRAMUSCULAR | Status: AC
  Filled 2011-12-19: qty 2

## 2011-12-19 MED ORDER — ONDANSETRON HCL 4 MG/2ML IJ SOLN: 4.0000 mg | INTRAMUSCULAR | Status: DC | PRN

## 2011-12-19 MED ORDER — DIPHENHYDRAMINE HCL 25 MG PO CAPS: 25.0000 mg | ORAL_CAPSULE | Freq: Four times a day (QID) | ORAL | Status: DC | PRN

## 2011-12-19 MED ORDER — DIBUCAINE 1 % RE OINT: 1.0000 "application " | TOPICAL_OINTMENT | RECTAL | Status: DC | PRN

## 2011-12-19 MED ORDER — OXYTOCIN 40 UNITS IN LACTATED RINGERS INFUSION - SIMPLE MED
INTRAVENOUS | Status: DC | PRN
  Administered 2011-12-19: 40 [IU] via INTRAVENOUS

## 2011-12-19 MED ORDER — ONDANSETRON HCL 4 MG/2ML IJ SOLN
INTRAMUSCULAR | Status: AC
  Filled 2011-12-19: qty 2

## 2011-12-19 MED ORDER — PHENYLEPHRINE HCL 10 MG/ML IJ SOLN
INTRAMUSCULAR | Status: DC | PRN
  Administered 2011-12-19: 80 ug via INTRAVENOUS
  Administered 2011-12-19: 120 ug via INTRAVENOUS
  Administered 2011-12-19: 40 ug via INTRAVENOUS
  Administered 2011-12-19 (×2): 80 ug via INTRAVENOUS

## 2011-12-19 MED ORDER — LACTATED RINGERS IV SOLN
INTRAVENOUS | Status: DC
  Administered 2011-12-19 (×4): via INTRAVENOUS

## 2011-12-19 MED ORDER — SIMETHICONE 80 MG PO CHEW: 80.0000 mg | CHEWABLE_TABLET | ORAL | Status: DC | PRN

## 2011-12-19 MED ORDER — LACTATED RINGERS IV SOLN
INTRAVENOUS | Status: DC
  Administered 2011-12-19 – 2011-12-20 (×2): via INTRAVENOUS

## 2011-12-19 MED ORDER — DIPHENHYDRAMINE HCL 50 MG/ML IJ SOLN: 25.0000 mg | INTRAMUSCULAR | Status: DC | PRN

## 2011-12-19 MED ORDER — DEXTROSE 5 % IV SOLN
3.0000 g | Freq: Once | INTRAVENOUS | Status: DC
  Filled 2011-12-19: qty 3000

## 2011-12-19 MED ORDER — BUPIVACAINE HCL (PF) 0.5 % IJ SOLN
INTRAMUSCULAR | Status: DC | PRN
  Administered 2011-12-19: 30 mL

## 2011-12-19 MED ORDER — OXYCODONE-ACETAMINOPHEN 5-325 MG PO TABS
1.0000 | ORAL_TABLET | ORAL | Status: DC | PRN
  Administered 2011-12-20 (×2): 1 via ORAL
  Administered 2011-12-20: 2 via ORAL
  Administered 2011-12-20 – 2011-12-21 (×2): 1 via ORAL
  Administered 2011-12-21: 2 via ORAL
  Filled 2011-12-19: qty 1
  Filled 2011-12-19: qty 2
  Filled 2011-12-19 (×2): qty 1
  Filled 2011-12-19: qty 2
  Filled 2011-12-19: qty 1

## 2011-12-19 MED ORDER — MORPHINE SULFATE (PF) 0.5 MG/ML IJ SOLN
INTRAMUSCULAR | Status: DC | PRN
  Administered 2011-12-19: .1 mg via INTRATHECAL

## 2011-12-19 MED ORDER — WITCH HAZEL-GLYCERIN EX PADS: 1.0000 "application " | MEDICATED_PAD | CUTANEOUS | Status: DC | PRN

## 2011-12-19 MED ORDER — EPHEDRINE 5 MG/ML INJ
INTRAVENOUS | Status: AC
  Filled 2011-12-19: qty 10

## 2011-12-19 MED ORDER — ONDANSETRON HCL 4 MG/2ML IJ SOLN: 4.0000 mg | Freq: Three times a day (TID) | INTRAMUSCULAR | Status: DC | PRN

## 2011-12-19 MED ORDER — DIPHENHYDRAMINE HCL 50 MG/ML IJ SOLN
12.5000 mg | INTRAMUSCULAR | Status: DC | PRN
  Administered 2011-12-19: 12.5 mg via INTRAVENOUS
  Filled 2011-12-19: qty 1

## 2011-12-19 MED ORDER — EPHEDRINE SULFATE 50 MG/ML IJ SOLN
INTRAMUSCULAR | Status: DC | PRN
  Administered 2011-12-19 (×3): 10 mg via INTRAVENOUS

## 2011-12-19 MED ORDER — OXYTOCIN 40 UNITS IN LACTATED RINGERS INFUSION - SIMPLE MED: 62.5000 mL/h | INTRAVENOUS | Status: AC

## 2011-12-19 MED ORDER — MISOPROSTOL 200 MCG PO TABS
800.0000 ug | ORAL_TABLET | Freq: Once | ORAL | Status: DC | PRN
  Filled 2011-12-19: qty 4

## 2011-12-19 MED ORDER — TETANUS-DIPHTH-ACELL PERTUSSIS 5-2.5-18.5 LF-MCG/0.5 IM SUSP: 0.5000 mL | Freq: Once | INTRAMUSCULAR | Status: DC

## 2011-12-19 MED ORDER — BUPIVACAINE HCL (PF) 0.5 % IJ SOLN
INTRAMUSCULAR | Status: AC
  Filled 2011-12-19: qty 30

## 2011-12-19 MED ORDER — CEFAZOLIN SODIUM-DEXTROSE 2-3 GM-% IV SOLR
3.0000 g | Freq: Once | INTRAVENOUS | Status: DC
  Filled 2011-12-19: qty 100

## 2011-12-19 MED ORDER — BUPIVACAINE IN DEXTROSE 0.75-8.25 % IT SOLN
INTRATHECAL | Status: DC | PRN
  Administered 2011-12-19: 1.4 mL via INTRATHECAL

## 2011-12-19 MED ORDER — PRENATAL MULTIVITAMIN CH
1.0000 | ORAL_TABLET | Freq: Every day | ORAL | Status: DC
  Administered 2011-12-20 – 2011-12-21 (×2): 1 via ORAL
  Filled 2011-12-19 (×2): qty 1

## 2011-12-19 MED ORDER — IBUPROFEN 600 MG PO TABS: 600.0000 mg | ORAL_TABLET | Freq: Four times a day (QID) | ORAL | Status: DC | PRN

## 2011-12-19 MED ORDER — SCOPOLAMINE 1 MG/3DAYS TD PT72
MEDICATED_PATCH | TRANSDERMAL | Status: AC
  Administered 2011-12-19: 1.5 mg via TRANSDERMAL
  Filled 2011-12-19: qty 1

## 2011-12-19 MED ORDER — ONDANSETRON HCL 4 MG PO TABS: 4.0000 mg | ORAL_TABLET | ORAL | Status: DC | PRN

## 2011-12-19 MED ORDER — ZOLPIDEM TARTRATE 5 MG PO TABS: 5.0000 mg | ORAL_TABLET | Freq: Every evening | ORAL | Status: DC | PRN

## 2011-12-19 MED ORDER — SENNOSIDES-DOCUSATE SODIUM 8.6-50 MG PO TABS
2.0000 | ORAL_TABLET | Freq: Every day | ORAL | Status: DC
  Administered 2011-12-19 – 2011-12-20 (×2): 2 via ORAL

## 2011-12-19 MED ORDER — LANOLIN HYDROUS EX OINT: 1.0000 "application " | TOPICAL_OINTMENT | CUTANEOUS | Status: DC | PRN

## 2011-12-19 MED ORDER — SODIUM CHLORIDE 0.9 % IV SOLN: 1.0000 ug/kg/h | INTRAVENOUS | Status: DC | PRN

## 2011-12-19 MED ORDER — NALOXONE HCL 0.4 MG/ML IJ SOLN: 0.4000 mg | INTRAMUSCULAR | Status: DC | PRN

## 2011-12-19 MED ORDER — METOCLOPRAMIDE HCL 5 MG/ML IJ SOLN: 10.0000 mg | Freq: Three times a day (TID) | INTRAMUSCULAR | Status: DC | PRN

## 2011-12-19 SURGICAL SUPPLY — 46 items
BARRIER ADHS 3X4 INTERCEED (GAUZE/BANDAGES/DRESSINGS) ×2 IMPLANT
BENZOIN TINCTURE PRP APPL 2/3 (GAUZE/BANDAGES/DRESSINGS) ×2 IMPLANT
BINDER ABD UNIV 10 28-50 (GAUZE/BANDAGES/DRESSINGS) ×1 IMPLANT
BINDER ABD UNIV 12 45-62 (WOUND CARE) IMPLANT
BINDER ABDOM UNIV 10 (GAUZE/BANDAGES/DRESSINGS) ×2
BINDER ABDOMINAL 46IN 62IN (WOUND CARE)
CLOTH BEACON ORANGE TIMEOUT ST (SAFETY) ×2 IMPLANT
DRAPE SURG 17X23 STRL (DRAPES) ×2 IMPLANT
DRESSING TELFA 8X3 (GAUZE/BANDAGES/DRESSINGS) ×2 IMPLANT
DRSG COVADERM 4X10 (GAUZE/BANDAGES/DRESSINGS) ×2 IMPLANT
DURAPREP 26ML APPLICATOR (WOUND CARE) ×2 IMPLANT
ELECT REM PT RETURN 9FT ADLT (ELECTROSURGICAL) ×2
ELECTRODE REM PT RTRN 9FT ADLT (ELECTROSURGICAL) ×1 IMPLANT
EXTRACTOR VACUUM M CUP 4 TUBE (SUCTIONS) IMPLANT
GLOVE BIO SURGEON STRL SZ7 (GLOVE) ×4 IMPLANT
GLOVE BIOGEL PI IND STRL 7.0 (GLOVE) ×4 IMPLANT
GLOVE BIOGEL PI INDICATOR 7.0 (GLOVE) ×4
GLOVE SURG SS PI 7.0 STRL IVOR (GLOVE) ×4 IMPLANT
GOWN PREVENTION PLUS LG XLONG (DISPOSABLE) ×2 IMPLANT
GOWN STRL REIN XL XLG (GOWN DISPOSABLE) ×2 IMPLANT
KIT ABG SYR 3ML LUER SLIP (SYRINGE) IMPLANT
NEEDLE HYPO 22GX1.5 SAFETY (NEEDLE) ×2 IMPLANT
NEEDLE HYPO 25X5/8 SAFETYGLIDE (NEEDLE) IMPLANT
NEEDLE SPNL 22GX3.5 QUINCKE BK (NEEDLE) ×2 IMPLANT
NS IRRIG 1000ML POUR BTL (IV SOLUTION) ×2 IMPLANT
PACK C SECTION WH (CUSTOM PROCEDURE TRAY) ×2 IMPLANT
PAD ABD 7.5X8 STRL (GAUZE/BANDAGES/DRESSINGS) ×4 IMPLANT
PAD OB MATERNITY 4.3X12.25 (PERSONAL CARE ITEMS) ×2 IMPLANT
RTRCTR C-SECT PINK 25CM LRG (MISCELLANEOUS) IMPLANT
SLEEVE SCD COMPRESS KNEE MED (MISCELLANEOUS) IMPLANT
SPONGE SURGIFOAM ABS GEL 12-7 (HEMOSTASIS) IMPLANT
STAPLER VISISTAT 35W (STAPLE) IMPLANT
STRIP CLOSURE SKIN 1/2X4 (GAUZE/BANDAGES/DRESSINGS) ×2 IMPLANT
SUT PDS AB 0 CTX 60 (SUTURE) IMPLANT
SUT PLAIN 0 NONE (SUTURE) IMPLANT
SUT SILK 0 TIES 10X30 (SUTURE) IMPLANT
SUT VIC AB 0 CT1 36 (SUTURE) ×8 IMPLANT
SUT VIC AB 3-0 CTX 36 (SUTURE) ×2 IMPLANT
SUT VIC AB 3-0 SH 27 (SUTURE) ×1
SUT VIC AB 3-0 SH 27X BRD (SUTURE) ×1 IMPLANT
SUT VIC AB 4-0 KS 27 (SUTURE) ×2 IMPLANT
SYR CONTROL 10ML LL (SYRINGE) ×2 IMPLANT
TAPE CLOTH SURG 4X10 WHT LF (GAUZE/BANDAGES/DRESSINGS) ×2 IMPLANT
TOWEL OR 17X24 6PK STRL BLUE (TOWEL DISPOSABLE) ×4 IMPLANT
TRAY FOLEY CATH 14FR (SET/KITS/TRAYS/PACK) ×2 IMPLANT
WATER STERILE IRR 1000ML POUR (IV SOLUTION) ×2 IMPLANT

## 2011-12-19 NOTE — H&P (Signed)
Tashai Catino is a 41 y.o. female presenting for primary c-section due to h/o macrosomia. Maternal Medical History:  Fetal activity: Perceived fetal activity is normal.    Prenatal complications: No bleeding.     OB History    Grav Para Term Preterm Abortions TAB SAB Ect Mult Living   8 6 6  1  1   6      Past Medical History  Diagnosis Date  . Gestational diabetes 10/10/2011   Past Surgical History  Procedure Date  . No past surgeries    Family History: family history includes Diabetes in her maternal grandmother. Social History:  reports that she has never smoked. She has never used smokeless tobacco. She reports that she does not drink alcohol or use illicit drugs.   Prenatal Transfer Tool  Maternal Diabetes: Yes:  Diabetes Type:  Insulin/Medication controlled Genetic Screening: Normal Maternal Ultrasounds/Referrals: Normal Fetal Ultrasounds or other Referrals:  None Maternal Substance Abuse:  No Significant Maternal Medications:  Meds include: Other:  Significant Maternal Lab Results:  None Other Comments:  Class A2 DM on Glyburide  ROS    Blood pressure 113/67, pulse 87, temperature 97.9 F (36.6 C), temperature source Oral, resp. rate 16, last menstrual period 03/21/2011, SpO2 98.00%, unknown if currently breastfeeding. 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   HARRAWAY-SMITH, Miracle Mongillo 12/19/2011, 1:38 PM

## 2011-12-19 NOTE — Progress Notes (Signed)
Called to OR 9 for repeat C/S. Maternal history significant for mutiparity (G8P6016); gestational diabetes, and concern for macrosomia of infant. Maternal prenatal labs negative. Infant delivered and transferred to radiant warmer. Dried, suctioned and stimulated. Apgars 8 (-2 color), 9 (-1 color) at 1 and 5 minutes respectively. Physical exam benign with the exception of large bilateral hydroceles. Dr. DiMaguila to assess when infant is in central nursery. Infant left in care of central nursery RN in OR to do skin to skin care with mother.  

## 2011-12-19 NOTE — Telephone Encounter (Signed)
Preadmission screen  

## 2011-12-19 NOTE — Anesthesia Postprocedure Evaluation (Signed)
Anesthesia Post Note  Patient: Christie Holder  Procedure(s) Performed: Procedure(s) (LRB): CESAREAN SECTION (N/A)  Anesthesia type: Spinal  Patient location: PACU  Post pain: Pain level controlled  Post assessment: Post-op Vital signs reviewed  Last Vitals:  Filed Vitals:   12/19/11 1730  BP: 107/64  Pulse: 77  Temp: 36.7 C  Resp: 16    Post vital signs: Reviewed  Level of consciousness: awake  Complications: No apparent anesthesia complications

## 2011-12-19 NOTE — Anesthesia Procedure Notes (Signed)
Spinal  Patient location during procedure: OR Start time: 12/19/2011 3:09 PM Staffing Performed by: anesthesiologist  Preanesthetic Checklist Completed: patient identified, site marked, surgical consent, pre-op evaluation, timeout performed, IV checked, risks and benefits discussed and monitors and equipment checked Spinal Block Patient position: sitting Prep: site prepped and draped and DuraPrep Patient monitoring: heart rate, cardiac monitor, continuous pulse ox and blood pressure Approach: midline Location: L3-4 Injection technique: single-shot Needle Needle type: Sprotte  Needle gauge: 24 G Needle length: 9 cm Assessment Sensory level: T4 Additional Notes Clear free flow CSF on first attempt.  No paresthesia.  Patient tolerated procedure well.  Jasmine December MD

## 2011-12-19 NOTE — Transfer of Care (Signed)
Immediate Anesthesia Transfer of Care Note  Patient: Christie Holder  Procedure(s) Performed: Procedure(s) (LRB) with comments: CESAREAN SECTION (N/A)  Patient Location: PACU  Anesthesia Type: Spinal  Level of Consciousness: awake, alert  and oriented  Airway & Oxygen Therapy: Patient Spontanous Breathing  Post-op Assessment: Report given to PACU RN and Post -op Vital signs reviewed and stable  Post vital signs: Reviewed and stable  Complications: No apparent anesthesia complications

## 2011-12-19 NOTE — Telephone Encounter (Signed)
Telephone call from patient's daughter Peerenboom (who translates for her ) that her mother has changed her mind and does now want to be scheduled for a c/section.  Staff message to Dr. Macon Large who saw patient in clinic on 12/16/11.  Dr. Macon Large advised to schedule patient for c/section at 39wks on 12/19/11.

## 2011-12-19 NOTE — Op Note (Signed)
Christie Holder  PROCEDURE DATE: 12/19/2011  PREOPERATIVE DIAGNOSIS: Intrauterine pregnancy at  [redacted]w[redacted]d weeks gestation; macrosomia, malpresentation  POSTOPERATIVE DIAGNOSIS: The same  PROCEDURE: Primary Low Transverse Cesarean Section  SURGEON:  Dr. Eber Jones L. Harraway-Smith  ASSISTANT:  Dr. Napoleon Form   INDICATIONS: [name] is a 41 y.o. [id number] at [redacted]w[redacted]d here for primary cesarean section secondary to the indications listed under preoperative diagnosis; please see preoperative note for further details.  The risks of cesarean section were discussed with the patient including but were not limited to: bleeding which may require transfusion or reoperation; infection which may require antibiotics; injury to bowel, bladder, ureters or other surrounding organs; injury to the fetus; need for additional procedures including hysterectomy in the event of a life-threatening hemorrhage; placental abnormalities wth subsequent pregnancies, incisional problems, thromboembolic phenomenon and other postoperative/anesthesia complications.   The patient concurred with the proposed plan, giving informed written consent for the procedure.    FINDINGS:  Viable female infant in breech presentation.  Apgars 8 and 9.  Clear amniotic fluid.  Intact placenta, three vessel cord.  Normal uterus, fallopian tubes and ovaries bilaterally.  ANESTHESIA: Spinal/Epidural INTRAVENOUS FLUIDS: 3800 ml ESTIMATED BLOOD LOSS: 700 ml URINE OUTPUT:  400 ml SPECIMENS: Placenta sent to L&D COMPLICATIONS: None immediate  PROCEDURE IN DETAIL:  The patient preoperatively received intravenous antibiotics and had sequential compression devices applied to her lower extremities.  She was then taken to the operating room where spinal anesthesia was administered and was found to be adequate. She was then placed in a dorsal supine position with a leftward tilt, and prepped and draped in a sterile manner.  A foley catheter was  placed into her bladder and attached to constant gravity.  After an adequate timeout was performed, a Pfannenstiel skin incision was made with scalpel and carried through to the underlying layer of fascia. The fascia was incised in the midline, and this incision was extended bilaterally using the Mayo scissors.  Kocher clamps were applied to the superior aspect of the fascial incision and the underlying rectus muscles were dissected off bluntly. A similar process was carried out on the inferior aspect of the fascial incision. The rectus muscles were separated in the midline bluntly and the peritoneum was entered bluntly. Attention was turned to the lower uterine segment where a low transverse hysterotomy incision was made with a scalpel and extended bilaterally bluntly.  The infant was successfully delivered, the cord was clamped and cut and the infant was handed over to awaiting neonatology team. Uterine massage was then administered, and the placenta delivered intact with a three-vessel cord. The uterus was then cleared of clot and debris.  The hysterotomy was closed with 0 Vicryl in a running locked fashion, and an imbricating layer was also placed with the same suture. The pelvis was cleared of all clot and debris. Hemostasis was confirmed on all surfaces.  The peritoneum and the muscles were reapproximated using 0 Vicryl in 1 interrupted suture. The fascia was then closed using 0 Vicryl in a running fashion.  The subcutaneous layer was irrigated, then reapproximated with 3-0 vicryl in a running locked fashion, and the skin was closed with a 4-0 Vicryl subcuticular stitch. 30 ml of 0.5% Marcaine was injected subcutaneously around the incision. The patient tolerated the procedure well. Sponge, lap, instrument and needle counts were correct x 2.  She was taken to the recovery room in stable condition.

## 2011-12-19 NOTE — Anesthesia Preprocedure Evaluation (Addendum)
Anesthesia Evaluation  Patient identified by MRN, date of birth, ID band Patient awake    Reviewed: Allergy & Precautions, H&P , Patient's Chart, lab work & pertinent test results  Airway Mallampati: II TM Distance: >3 FB Neck ROM: full    Dental No notable dental hx.    Pulmonary  breath sounds clear to auscultation  Pulmonary exam normal       Cardiovascular Exercise Tolerance: Good Rhythm:regular Rate:Normal     Neuro/Psych    GI/Hepatic   Endo/Other  diabetesMorbid obesity  Renal/GU      Musculoskeletal   Abdominal   Peds  Hematology   Anesthesia Other Findings Hemabate in room; discussed with [facility] CRNA  Reproductive/Obstetrics                          Anesthesia Physical Anesthesia Plan  ASA: III  Anesthesia Plan: Spinal   Post-op Pain Management:    Induction:   Airway Management Planned:   Additional Equipment:   Intra-op Plan:   Post-operative Plan:   Informed Consent: I have reviewed the patients History and Physical, chart, labs and discussed the procedure including the risks, benefits and alternatives for the proposed anesthesia with the patient or authorized representative who has indicated his/her understanding and acceptance.   Dental Advisory Given  Plan Discussed with: CRNA  Anesthesia Plan Comments: (Lab work confirmed with CRNA in room. Platelets okay. Discussed spinal anesthetic, and patient consents to the procedure:  included risk of possible headache,backache, failed block, allergic reaction, and nerve injury. This patient was asked if she had any questions or concerns before the procedure started. )        Anesthesia Quick Evaluation

## 2011-12-20 ENCOUNTER — Encounter (HOSPITAL_COMMUNITY): Payer: Self-pay | Admitting: Obstetrics & Gynecology

## 2011-12-20 LAB — CBC
Hemoglobin: 11 g/dL — ABNORMAL LOW (ref 12.0–15.0)
MCHC: 35 g/dL (ref 30.0–36.0)
Platelets: 216 10*3/uL (ref 150–400)
RBC: 3.26 MIL/uL — ABNORMAL LOW (ref 3.87–5.11)

## 2011-12-20 LAB — BIRTH TISSUE RECOVERY COLLECTION (PLACENTA DONATION)

## 2011-12-20 NOTE — Addendum Note (Signed)
Addendum  created 12/20/11 1402 by Gertie Fey, CRNA   Modules edited:Notes Section

## 2011-12-20 NOTE — Anesthesia Postprocedure Evaluation (Signed)
  Anesthesia Post-op Note  Patient: Christie Holder  Procedure(s) Performed: Procedure(s) (LRB) with comments: CESAREAN SECTION (N/A)  Patient Location: PACU and Mother/Baby  Anesthesia Type: Spinal  Level of Consciousness: awake, alert  and oriented  Airway and Oxygen Therapy: Patient Spontanous Breathing  Post-op Pain: mild  Post-op Assessment: Post-op Vital signs reviewed  Post-op Vital Signs: Reviewed and stable  Complications: No apparent anesthesia complications

## 2011-12-20 NOTE — Progress Notes (Signed)
UR chart review completed.  

## 2011-12-20 NOTE — Progress Notes (Signed)
Subjective: Postpartum Day 1: Cesarean Delivery Patient reports incisional pain and tolerating PO.  Foley just out, has not voided yet on own.  Objective: Vital signs in last 24 hours: Temp:  [97.8 F (36.6 C)-98.4 F (36.9 C)] 98.3 F (36.8 C) (09/27 0300) Pulse Rate:  [65-99] 74  (09/27 0300) Resp:  [16-22] 18  (09/27 0300) BP: (94-120)/(55-79) 101/63 mmHg (09/27 0300) SpO2:  [92 %-98 %] 93 % (09/27 0300) Weight:  [271 lb 8 oz (123.152 kg)] 271 lb 8 oz (123.152 kg) (09/26 1812)  Physical Exam:  General: alert, cooperative and no distress Lochia: appropriate Uterine Fundus: firm Incision: small amount of red/brown drainage on dressing DVT Evaluation: No evidence of DVT seen on physical exam.  Filed Vitals:   12/19/11 2249 12/19/11 2308 12/20/11 0100 12/20/11 0300  BP: 114/77 110/70 105/68 101/63  Pulse: 99 84 79 74  Temp:  98.3 F (36.8 C) 98.4 F (36.9 C) 98.3 F (36.8 C)  TempSrc:  Oral Oral Oral  Resp: 20 18 18 18   Weight:      SpO2:  93% 93% 93%      Basename 12/20/11 0547 12/19/11 1248  HGB 11.0* 12.1  HCT 31.4* 35.0*    Assessment/Plan: Status post Cesarean section. Doing well postoperatively.  Continue current care.  Napoleon Form 12/20/2011, 11:46 AM

## 2011-12-21 MED ORDER — SENNOSIDES-DOCUSATE SODIUM 8.6-50 MG PO TABS: 2.0000 | ORAL_TABLET | Freq: Every day | ORAL | Status: DC

## 2011-12-21 MED ORDER — IBUPROFEN 600 MG PO TABS: 600.0000 mg | ORAL_TABLET | Freq: Four times a day (QID) | ORAL | Status: DC

## 2011-12-21 MED ORDER — OXYCODONE-ACETAMINOPHEN 5-325 MG PO TABS: 1.0000 | ORAL_TABLET | ORAL | Status: DC | PRN

## 2011-12-21 MED ORDER — NORETHINDRONE 0.35 MG PO TABS: 1.0000 | ORAL_TABLET | Freq: Every day | ORAL | Status: DC

## 2011-12-21 NOTE — Discharge Summary (Signed)
Obstetric Discharge Summary Reason for Admission: cesarean section Prenatal Procedures: ultrasound Intrapartum Procedures: cesarean: low cervical, transverse Postpartum Procedures: none Complications-Operative and Postpartum: none Hemoglobin  Date Value Range Status  12/20/2011 11.0* 12.0 - 15.0 g/dL Final     HCT  Date Value Range Status  12/20/2011 31.4* 36.0 - 46.0 % Final    Physical Exam:  General: alert, cooperative and no distress Lochia: appropriate Uterine Fundus: firm Incision: healing well, no significant drainage, no dehiscence, no significant erythema DVT Evaluation: No evidence of DVT seen on physical exam.  Discharge Diagnoses: Term Pregnancy-delivered  Discharge Information: Date: 12/21/2011 Activity: pelvic rest Diet: routine Medications: PNV, Ibuprofen, Colace and Percocet Condition: improved Instructions: refer to practice specific booklet Discharge to: home   Newborn Data: Live born female  Birth Weight: 10 lb 7.9 oz (4760 g) APGAR: 8, 9  Home with mother.  Dylann Gallier 12/21/2011, 7:40 AM

## 2011-12-21 NOTE — Discharge Summary (Signed)
Saw patient and I agree with this note ARNOLD,JAMES 12/21/2011

## 2011-12-22 LAB — TYPE AND SCREEN: Unit division: 0

## 2011-12-25 ENCOUNTER — Other Ambulatory Visit: Payer: Self-pay | Admitting: *Deleted

## 2011-12-25 MED ORDER — HYDROCODONE-ACETAMINOPHEN 5-500 MG PO TABS: ORAL_TABLET | ORAL | Status: DC

## 2011-12-25 NOTE — Telephone Encounter (Signed)
Christie Holder called 12/24/11 at 2:22pm and left a message requesting a refill on her oxycodone, had c/s and states is out of her oxycodone.

## 2011-12-25 NOTE — Telephone Encounter (Signed)
Returned pt call and discussed her request. She states that she has been taking Percocet 2 tabs every 4 hrs and Ibuprofen 600 mg every 6 hrs. I explained that she may be taking too much narcotic. I stated that we expect her to have some discomfort and that she should not be trying to take enough pain med to be completely pain free. Also, she will want to continue her stool softener to avoid against becoming constipated. I told pt that I will contact MD on call for med order and call her back. Pt stated that I may give information to her daughter when I call back. I obtained order from Dr. Debroah Loop for Vicodin. When I called pt back I spoke w/her daughter. I informed her that Hankerson should try to take only 1 tab of pain med @ each dose unless she is having severe pain, then she may take 2 tabs. I also stated that she will most likely not be able to receive additional strong pain medicine (narcotic) after this prescription, so she should try to make it last. She should continue to take ibuprofen 600 mg every 6 hrs. Pt's daughter voiced understanding and stated that she will explain all information to her mother.

## 2011-12-27 ENCOUNTER — Inpatient Hospital Stay (HOSPITAL_COMMUNITY)
Admission: AD | Admit: 2011-12-27 | Discharge: 2011-12-27 | Disposition: A | Discharge status: Home / Self Care | Payer: MEDICAID | Source: Ambulatory Visit | Attending: Obstetrics & Gynecology | Admitting: Obstetrics & Gynecology

## 2011-12-27 ENCOUNTER — Encounter (HOSPITAL_COMMUNITY): Payer: Self-pay | Admitting: *Deleted

## 2011-12-27 DIAGNOSIS — O909 Complication of the puerperium, unspecified: Secondary | ICD-10-CM

## 2011-12-27 DIAGNOSIS — N907 Vulvar cyst: Secondary | ICD-10-CM

## 2011-12-27 DIAGNOSIS — T8149XA Infection following a procedure, other surgical site, initial encounter: Secondary | ICD-10-CM

## 2011-12-27 DIAGNOSIS — N949 Unspecified condition associated with female genital organs and menstrual cycle: Secondary | ICD-10-CM

## 2011-12-27 DIAGNOSIS — O99893 Other specified diseases and conditions complicating puerperium: Secondary | ICD-10-CM

## 2011-12-27 LAB — CBC
HCT: 33.9 % — ABNORMAL LOW (ref 36.0–46.0)
RDW: 13.2 % (ref 11.5–15.5)
WBC: 5.6 10*3/uL (ref 4.0–10.5)

## 2011-12-27 MED ORDER — CEPHALEXIN 500 MG PO CAPS: 500.0000 mg | ORAL_CAPSULE | Freq: Four times a day (QID) | ORAL | Status: DC

## 2011-12-27 NOTE — MAU Note (Signed)
C/S 12/19/2011. Baby weighed 10 lbs. and was breech. States last baby was 10 lbs and had a problem with the shoulder, so this C/S was scheduled. Today, C/O "bumps on her vagina lips." States no itching, but is painful. States she had allergic reaction to tape and has irritation on her abdomen. States she has pus coming from both sides of incision that has an odor.

## 2011-12-27 NOTE — MAU Provider Note (Signed)
Chief Complaint: No chief complaint on file.   None    SUBJECTIVE HPI: Christie Holder is a 41 y.o. [id number] 8 days PP C/S for Breech who presents to MAu reporting 1. 3-4 large, painful lumps on her labia majora.   2. Possible allergic reaction to tape. Has irritation on her abdomen. Resolving w/ cortisone cream.  3. Pus coming from both sides of incision that has an odor.   Past Medical History  Diagnosis Date  . Gestational diabetes 10/10/2011   OB History    Grav Para Term Preterm Abortions TAB SAB Ect Mult Living   8 7 7  1  1   7      # Outc Date GA Lbr Len/2nd Wgt Sex Del Anes PTL Lv   1 TRM 12/94 [redacted]w[redacted]d  5lb(2.268kg) F SVD None No Yes   Comments: Born in [place]   2 TRM 2/96 [redacted]w[redacted]d  10lb(4.536kg) F SVD None No Yes   Comments: Fainted when she delivered in [place]   3 TRM 10/99 [redacted]w[redacted]d  11lb(4.99kg) F SVD None No Yes   Comments: Born in [place]   4 TRM 1/05 [redacted]w[redacted]d  10lb(4.536kg) F SVD None No Yes   Comments: Born in [place]   5 TRM 7/06 [redacted]w[redacted]d  10lb(4.536kg) M SVD EPI No Yes   Comments: Delivered at [facility], mild shoulder dystocia, [id number]   6 SAB 2009 [redacted]w[redacted]d          7 TRM 11/11 [redacted]w[redacted]d  8lb(3.629kg) M SVD EPI No Yes   8 TRM 9/13 [redacted]w[redacted]d 00:00 10lb7.9oz(4.76kg) M LTCS Spinal  Yes     Past Surgical History  Procedure Date  . No past surgeries   . Cesarean section 12/19/2011    Procedure: CESAREAN SECTION;  Surgeon: Willodean Rosenthal, MD;  Location: [facility];  Service: Obstetrics;  Laterality: N/A;   History   Social History  . Marital Status: Single    Spouse Name: N/A    Number of Children: N/A  . Years of Education: N/A   Occupational History  . Not on file.   Social History Main Topics  . Smoking status: Never Smoker   . Smokeless tobacco: Never Used  . Alcohol Use: No  . Drug Use: No  . Sexually Active: Yes   Other Topics Concern  . Not on file   Social History Narrative  . No narrative on file   No current facility-administered medications on file prior to  encounter.   Current Outpatient Prescriptions on File Prior to Encounter  Medication Sig Dispense Refill  . acetaminophen (TYLENOL) 500 MG tablet Take 1,000 mg by mouth every 6 (six) hours as needed. For pain      . HYDROcodone-acetaminophen (VICODIN) 5-500 MG per tablet Take 1-2 tablets by mouth every 6 hrs as needed for moderate - severe pain  30 tablet  0  . ibuprofen (ADVIL,MOTRIN) 600 MG tablet Take 1 tablet (600 mg total) by mouth every 6 (six) hours.  30 tablet  1  . neomycin-polymyxin-hydrocortisone (CORTISPORIN) otic solution Place 3 drops into the left ear 4 (four) times daily.      Marland Kitchen oxyCODONE-acetaminophen (PERCOCET/ROXICET) 5-325 MG per tablet Take 1-2 tablets by mouth every 4 (four) hours as needed (moderate - severe pain).  30 tablet  0  . Prenatal Vit-Fe Fumarate-FA (PRENATAL MULTIVITAMIN) TABS Take 1 tablet by mouth daily.      Marland Kitchen senna-docusate (SENOKOT-S) 8.6-50 MG per tablet Take 2 tablets by mouth at bedtime.  60 tablet  1  .  norethindrone (ORTHO MICRONOR) 0.35 MG tablet Take 1 tablet (0.35 mg total) by mouth daily. Start in 3 weeks (01/12/12)  1 Package  11   No Known Allergies  ROS: Pertinent items in HPI  OBJECTIVE Blood pressure 130/79, pulse 84, temperature 97.2 F (36.2 C), temperature source Oral, resp. rate 18, last menstrual period 03/21/2011, SpO2 99.00%, unknown if currently breastfeeding. GENERAL: Well-developed, well-nourished female in no acute distress.  HEENT: Normocephalic HEART: normal rate RESP: normal effort ABDOMEN: Soft, mildly non-tender. LTCS incision will approximated, healing. No redness, swelling or drainage. Scant amount of purulent drainage from site where suture was buried at left end in incision. Very tender.  EXTREMITIES: Nontender, no edema NEURO: Alert and oriented SPECULUM EXAM: deferred PELVIC EXAM: three 1-1.5 cm firm, tender, mobile masses palpated in labia majora. Two on left one on right. No erythema, warmth or drainage.  LAB  RESULTS Results for orders placed during the hospital encounter of 12/27/11 (from the past 24 hour(s))  CBC     Status: Abnormal   Collection Time   12/27/11  5:06 PM      Component Value Range   WBC 5.6  4.0 - 10.5 K/uL   RBC 3.49 (*) 3.87 - 5.11 MIL/uL   Hemoglobin 11.3 (*) 12.0 - 15.0 g/dL   HCT 16.1 (*) 09.6 - 04.5 %   MCV 97.1  78.0 - 100.0 fL   MCH 32.4  26.0 - 34.0 pg   MCHC 33.3  30.0 - 36.0 g/dL   RDW 40.9  81.1 - 91.4 %   Platelets 303  150 - 400 K/uL    IMAGING  MAU COURSE  ASSESSMENT 1. Surgical wound infection   2. Inclusion cyst of vulva    PLAN Discharge home Warm compresses to labia/sitz baths 4-5 x/day Call clinic or return to MAU for fever >100.4 or no improvement after 48 hours of ABX. Follow-up Information    Follow up with [facility]. In 1 week. (or MAU as needed if symptoms worsen)    Contact information:   [address] [place] [address] [phone]          Medication List     As of 12/28/2011  3:15 PM    TAKE these medications         acetaminophen 500 MG tablet   Commonly known as: TYLENOL   Take 1,000 mg by mouth every 6 (six) hours as needed. For pain      cephALEXin 500 MG capsule   Commonly known as: KEFLEX   Take 1 capsule (500 mg total) by mouth 4 (four) times daily.      HYDROcodone-acetaminophen 5-500 MG per tablet   Commonly known as: VICODIN   Take 1-2 tablets by mouth every 6 hrs as needed for moderate - severe pain      ibuprofen 600 MG tablet   Commonly known as: ADVIL,MOTRIN   Take 1 tablet (600 mg total) by mouth every 6 (six) hours.      neomycin-polymyxin-hydrocortisone otic solution   Commonly known as: CORTISPORIN   Place 3 drops into the left ear 4 (four) times daily.      norethindrone 0.35 MG tablet   Commonly known as: MICRONOR,CAMILA,ERRIN   Take 1 tablet (0.35 mg total) by mouth daily. Start in 3 weeks (01/12/12)      oxyCODONE-acetaminophen 5-325 MG per tablet    Commonly known as: PERCOCET/ROXICET   Take 1-2 tablets by mouth every 4 (four) hours as  needed (moderate - severe pain).      prenatal multivitamin Tabs   Take 1 tablet by mouth daily.      senna-docusate 8.6-50 MG per tablet   Commonly known as: Senokot-S   Take 2 tablets by mouth at bedtime.        [place], CNM 12/27/2011  5:16 PM

## 2011-12-28 NOTE — MAU Provider Note (Signed)
Attestation of Attending Supervision of Advanced Practitioner (CNM/NP): Evaluation and management procedures were performed by the Advanced Practitioner under my supervision and collaboration.  I have reviewed the Advanced Practitioner's note and chart, and I agree with the management and plan.  HARRAWAY-SMITH, Jahmel Flannagan 4:55 PM      

## 2012-01-23 ENCOUNTER — Ambulatory Visit (INDEPENDENT_AMBULATORY_CARE_PROVIDER_SITE_OTHER): Payer: Self-pay | Admitting: Family Medicine

## 2012-01-23 ENCOUNTER — Encounter: Payer: Self-pay | Admitting: Family Medicine

## 2012-01-23 LAB — GLUCOSE, CAPILLARY: Glucose-Capillary: 100 mg/dL — ABNORMAL HIGH (ref 70–99)

## 2012-01-23 NOTE — Progress Notes (Signed)
Patient ID: Christie Holder, female   DOB: 1971-02-22, 41 y.o.   MRN: [id number] Occasional spotting  no urinary or stool probldms Burning pain in legs occasionall7 Some smell from incsion Normal postpartum issues Subjective:     Christie Holder is a 41 y.o. female who presents for a postpartum visit. She is 5 weeks  postpartum following a primary low transverse cesarean section for breech presentation and fetal macrosomia (10 lb, 7.9 oz).  I have fully reviewed the prenatal and intrapartum course. The delivery was at 39 gestational weeks. Outcome: viable female infant. Anesthesia: spinal. Postpartum course has been complicated by wound infection and vulvar inclusion cyst. The patient was seen in MAU on 12/27/11 with painful lump in labia and purulent drainage from surgical incision. Patient states vulvar lumps are resolved. Incision has odor but is not draining. Baby's course has been normal. Baby is breast feeding. Bleeding: minimal spotting. Bowel function is normal. Bladder function is normal. Patient is not sexually active. Contraception method is Pharmacist, hospital. Postpartum depression screening: negative. Pt also complains of burning pain in legs occasionally, mostly when standing, changing position, walking.   Diet controlled GDM antepartum.  Reviewed notes, labs, medications, allergies, problem list, medical and surgical history, social history.  Review of Systems GEN:  No fever, chills, fatigue, malaise HEENT:  No headaches, vision changes GI:  No constipation/diarrhea GU:  No dysuria, urinary incontinence, no vaginal discharge All other systems negative except as stated in HPI.  Objective:    BP 106/73  Pulse 70  Temp 98.5 F (36.9 C) (Oral)  Ht 5\' 2"  (1.575 m)  Wt 240 lb 3.2 oz (108.954 kg)  BMI 43.93 kg/m2  Breastfeeding? Yes   GEN:  Alert, WNWD, no acute distress HEENT:  NCAT, EOMI NECK:  Supple, no thyromegaly CV:  RRR, no murmurs RESP:  CTAB ABD:  Soft,  non-tender, non-distended, normal bowel sounds. Incision: no drainage; 3 small openings in incision, about 2-3 mm each. Two are connected with suture visible. When probed, the base of opening is 1mm or less below skin surface with no undermining. Suture cut and removed.  EXTREM:  No edema or tenderness       Assessment:    5-week postpartum exam. Pap smear not done at today's visit (normal pap in April 2013).  A1 GDM:  Random CBG today 100 Wound infection Labial inclusion cyst  Plan:  1.  Normal postpartum exam 2.  Contraception:  Micronor 3. A1 GDM:  Needs 2 hour GTT at 6 to 12 weeks. 4.  Wound infection - healing well. Some irritation/inflammation around sutures. Keep dry. Recheck in 2 weeks. 5.  Labial inclusion cyst - resolved - no tenderness 6.. Follow up in: 2 weeks or as needed.

## 2012-01-23 NOTE — Progress Notes (Signed)
Patient states she feels down because she cannot go out and do "normal things" but says this is normal after she has a baby

## 2012-01-23 NOTE — Patient Instructions (Signed)
Parto por cesrea, Cuidados posteriores  (Cesarean Delivery, Care After) Siga estas instrucciones durante las prximas semanas. Estas indicaciones le proporcionan informacin general acerca de cmo deber cuidarse despus del procedimiento. El mdico tambin podr darle instrucciones especficas. El tratamiento ha sido planificado segn las prcticas mdicas actuales, pero en algunos casos pueden ocurrir problemas. Comunquese con el mdico si tiene algn problema o tiene preguntas despus del procedimiento.  INSTRUCCIONES PARA EL CUIDADO EN EL HOGAR  La curacin puede demorar algn tiempo. Puede sentir molestias, sensibilidad, hinchazn y hematomas en el sitio de la operacin, durante algunas semanas. Esto es normal y mejorar a medida que pase el tiempo.  Actividad  Al volver a su casa, durante las 2 primeras semanas descanse siempre que pueda.  Siempre que le sea posible, solicite ayuda para realizar las actividades domsticas y para el cuidado del beb, durante 2  3 semanas.  Limite las tareas domsticas y la actividad social. Aumente gradualmente su actividad a medida que recupera la fuerza.  No suba escaleras ms de 2 o 3 veces por da.  No levante nada que sea ms pesado que su beb.  Siga las instrucciones de su mdico con respecto a conducir automviles.  Consulte con su mdico si puede practicar ejercicios. Nutricin  Puede volver a su dieta habitual. Consuma una dieta normal y bien balanceada.  Debe ingerir gran cantidad de lquido para mantener la orina de tono claro o color amarillo plido.  Siga tomando los suplementos para el perodo prenatal o el complejo multivitamnico.  No beba alcohol hasta que el mdico la autorice. Evacuacin Debe retornar a su funcin intestinal habitual. Si est constipada, consulte a su mdico para tomar un laxante suave que la ayudar a ir al bao. Los lquidos y los alimentos que contengan salvado la ayudarn para el problema de la  constipacin. Gradualmente agregue frutas, verduras y salvado a su dieta. Higiene  Puede darse una ducha, lavarse el cabello y usar la baera, excepto que su mdico le indique otra cosa.  Contine con los cuidados perineales hasta que la secrecin vaginal se detenga.  No se haga duchas vaginales ni use tampones hasta que el mdico la autorice. Fiebre Si se siente afiebrada o tiene escalofros, tmese la temperatura. La fiebre puede indicar que hay una infeccin. Las infecciones se tratan con medicamentos.  Control del Dolor  Tome slo medicamentos de venta libre o prescriptos, segn las indicaciones del mdico. No tome aspirina. Puede ocasionar hemorragias.  No conduzca mientras toma analgsicos.  Consulte a su mdico para volver a tomar o ajustar las dosis de sus medicamentos habituales. Cuidados de la Incisin  Limpie la herida (incisin) suavemente con jabn y agua.  Si el mdico la autoriza, deje la herida sin vendaje, excepto que observe supuracin o irritacin.  Si tiene pequeas tiras adhesivas que cruzan la incisin y no se caen dentro de los 7 das, retrelas suavemente.  Controle diariamente la incisin y observe si aumenta el enrojecimiento, supura, se hincha o se separa la piel.  Abrace una almohada cuando tosa o estornude. Esto ayuda a aliviar el dolor. Cuidados Vaginales La secrecin vaginal o la hemorragia pueden durar hasta 6 semanas. Si esta secrecin se torna de color rojo brillante, presenta un olor ftido, es demasiado abundante, contiene cogulos o si siente ardor al orinar o debe hacerlo con mucha frecuencia, llame al profesional que la asiste. Si la hemorragia disminuye y luego se hace ms intensa, es su organismo que le dice que debe disminuir la actividad   y relajarse ms. Relaciones Sexuales  Consulte a su mdico antes de reanudar la actividad sexual. Generalmente, luego de 4 a 6 semanas, si se siente bien y descansada, podr reanudar la actividad sexual.  Evite las posiciones que fuercen el sitio de la incisin.  Puede quedar embarazada antes de tener el perodo. Si reanuda las relaciones sexuales, debe utilizar algn mtodo anticonceptivo si no quiere quedar embarazada enseguida. Prcticas Saludables  Cumpla con todas las visitas de control, segn le indique su mdico. Generalmente el mdico indicar que vuelva en 2  3 semanas.  Contine con los exmenes plvicos anuales.  Contine con el autoexamen de mamas mensual y los exmenes anuales que incluyan un [place]. Cuidado de las Mamas  Si no est amamantando, y sus mamas se tornan sensibles, se endurecen o la leche gotea, puede usar un sostn que le ajuste firmemente y aplicar hielo.  Si est amamantando, use un buen sostn.  Comunquese con el profesional que la asiste si siente dolor en las mamas, sntomas similares a una gripe, fiebre o endurecimiento y enrojecimiento de las mamas. Depresin Posparto Despus del entusiasmo por la llegada del beb, generalmente puede suceder un perodo de abatimiento o depresin. Comente sus sensaciones con su pareja, familiares y amigos. Puede estar originado en la modificacin de los niveles de hormonas en el organismo. Si esto la preocupa, puede contactarse con el profesional que la asiste. MISCELNEAS  Limite el uso de bombachas de sostn o medias panty.  Si amamanta, puede ser que no tenga su perodo menstrual por algunos meses o ms. Esto es normal en una mujer que amamanta. Si no menstra dentro de las 6 semanas posteriores a la interrupcin de la lactancia, consulte a su mdico.  Si no est amamantando, debe esperar que comience a menstruar dentro de las 6 a 12 semanas posteriores al parto. Si no ha comenzado para la 11 semana, consulte a su mdico. SOLICITE ATENCIN MDICA SI:  Presenta enrojecimiento, hinchazn o aumento del dolor en la herida.  Aparece pus en la herida.  Advierte un olor ftido que proviene de la herida o del  vendaje.  La zona de la inyeccin intravenosa se hincha, se inflama o duele.  La herida se abre (los bordes no estn unidos).  Se siente mareada o sufre un desmayo.  Siente dolor al orinar u orina con sangre.  Presenta diarrea.  Presenta nuseas o vmitos.  Observa una secrecin vaginal anormal.  Aparece una erupcin cutnea.  Sufre algn tipo de reaccin anormal o alrgica debido a los medicamentos que toma.  El dolor no se alivia con los medicamentos o empeora.  Su temperatura es de 101 F (38.3 C), o es 100.4 F (38 C) tomada 2 veces en un perodo de 4 horas. SOLICITE ATENCIN MDICA DE INMEDIATO SI:  La temperatura se eleva por encima de 102 F (38.9 C).  Siente dolor abdominal.  Siente dolor en el pecho.  Le falta el aire.  Se desmaya.  Presenta dolor, enrojecimiento o hinchazn en las piernas.  Sufre una hemorragia intensa con o sin cogulos de sangre. Document Released: 03/11/2005 Document Revised: 06/03/2011 Exit[employer] Patient Information 2013 [employer].  

## 2012-02-06 ENCOUNTER — Ambulatory Visit (INDEPENDENT_AMBULATORY_CARE_PROVIDER_SITE_OTHER): Payer: Self-pay

## 2012-02-06 VITALS — BP 107/69 | HR 65 | Temp 96.9°F | Ht 60.0 in | Wt 239.5 lb

## 2012-02-06 DIAGNOSIS — Z09 Encounter for follow-up examination after completed treatment for conditions other than malignant neoplasm: Secondary | ICD-10-CM

## 2012-02-06 DIAGNOSIS — Z5189 Encounter for other specified aftercare: Secondary | ICD-10-CM

## 2012-02-06 DIAGNOSIS — N390 Urinary tract infection, site not specified: Secondary | ICD-10-CM

## 2012-02-06 DIAGNOSIS — O09299 Supervision of pregnancy with other poor reproductive or obstetric history, unspecified trimester: Secondary | ICD-10-CM

## 2012-02-06 LAB — POCT URINALYSIS DIP (DEVICE)
Bilirubin Urine: NEGATIVE
Glucose, UA: NEGATIVE mg/dL
Hgb urine dipstick: NEGATIVE
Leukocytes, UA: NEGATIVE
Nitrite: NEGATIVE

## 2012-02-06 NOTE — Progress Notes (Signed)
Pt came in for wound check.  Incision site healing appropriately- well approximated, .25in from left of incision not closed, no odor, no discharge, and a febrile.  Pt c/o urinary frequency with burning.  UA resulted normal.  Pt also c/o vaginal irritation on the mons.  Observed rash.  Pt informed me that she is taking benadryl and applying hydrocortisone cream to the area.  Informed Dr. Jolayne Panther of findings and per Dr. Jolayne Panther pt is to continue hydrocortisone and benadryl for irritation.  I asked pt if she had made any changes with soaps, laundry detergents, etc.  Pt stated that she had made changes in soap.  I advised pt to use a mild soap such as Dove.  Per Dr. Debroah Loop if pt does not receive some form of relief after a week from any of the above symptoms to please the clinics and make a gyn appt for further evaluation.  Spanish interpreter, Raynelle Fanning, instructed her of these recommendations.  Pt stated understanding and did not have any further questions.

## 2012-03-13 ENCOUNTER — Ambulatory Visit (INDEPENDENT_AMBULATORY_CARE_PROVIDER_SITE_OTHER): Payer: Self-pay | Admitting: Advanced Practice Midwife

## 2012-03-13 ENCOUNTER — Encounter: Payer: Self-pay | Admitting: Advanced Practice Midwife

## 2012-03-13 VITALS — BP 93/58 | HR 71 | Temp 97.3°F | Wt 244.2 lb

## 2012-03-13 DIAGNOSIS — R300 Dysuria: Secondary | ICD-10-CM

## 2012-03-13 LAB — POCT URINALYSIS DIP (DEVICE)
Bilirubin Urine: NEGATIVE
Glucose, UA: NEGATIVE mg/dL
Ketones, ur: NEGATIVE mg/dL
Protein, ur: NEGATIVE mg/dL

## 2012-03-13 MED ORDER — SULFAMETHOXAZOLE-TRIMETHOPRIM 800-160 MG PO TABS: 1.0000 | ORAL_TABLET | Freq: Two times a day (BID) | ORAL | Status: DC

## 2012-03-13 MED ORDER — PHENAZOPYRIDINE HCL 100 MG PO TABS: 100.0000 mg | ORAL_TABLET | Freq: Three times a day (TID) | ORAL | Status: DC | PRN

## 2012-03-13 NOTE — Patient Instructions (Addendum)
Infeccin urinaria   (Urinary Tract Infection)   La infeccin urinaria puede ocurrir en cualquier lugar del tracto urinario. El tracto urinario es un sistema de drenaje del cuerpo por el que se eliminan los desechos y el exceso de agua. El tracto urinario incluye dos riones, dos urteres, la vejiga y la uretra. Los riones son rganos que tienen forma de frijol. Cada rin tiene aproximadamente el tamao del puo. Estn situados debajo de las costillas, uno a cada lado de la columna vertebral  CAUSAS   La causa de la infeccin son los microbios, que son organismos microscpicos, que incluyen hongos, virus, y bacterias. Estos organismos son tan pequeos que slo pueden verse a travs del microscopio. Las bacterias son los microorganismos que ms comnmente causan infecciones urinarias.   SNTOMAS   Los sntomas pueden variar segn la edad y el sexo del paciente y por la ubicacin de la infeccin. Los sntomas en las mujeres jvenes incluyen la necesidad frecuente e intensa de orinar y una sensacin dolorosa de ardor en la vejiga o en la uretra durante la miccin. Las mujeres y los hombres mayores podrn sentir cansancio, temblores y debilidad y sentir dolores musculares y dolor abdominal. Si tiene fiebre, puede significar que la infeccin est en los riones. Otros sntomas son dolor en la espalda o en los lados debajo de las costillas, nuseas y vmitos.   DIAGNSTICO   Para diagnosticar una infeccin urinaria, el mdico le preguntar acerca de sus sntomas. Tambin le solicitar una muestra de orina. La muestra de orina se analiza para detectar bacterias y glbulos blancos de la sangre. Los glbulos blancos se forman en el organismo para ayudar a combatir las infecciones.   TRATAMIENTO   Por lo general, las infecciones urinarias pueden tratarse con medicamentos. Debido a que la mayora de las infecciones son causadas por bacterias, por lo general pueden tratarse con antibiticos. La eleccin del antibitico y la  duracin del tratamiento depender de sus sntomas y el tipo de bacteria causante de la infeccin.   INSTRUCCIONES PARA EL CUIDADO EN EL HOGAR    Si le recetaron antibiticos, tmelos exactamente como su mdico le indique. Termine el medicamento aunque se sienta mejor despus de haber tomado slo algunos.   Beba gran cantidad de lquido para mantener la orina de tono claro o color amarillo plido.   Evite la cafena, el t y las bebidas gaseosas. Estas sustancias irritan la vejiga.   Vaciar la vejiga con frecuencia. Evite retener la orina durante largos perodos.   Vace la vejiga antes y despus de tener relaciones sexuales.   Despus de mover el intestino, las mujeres deben higienizarse la regin perineal desde adelante hacia atrs. Use slo un papel tissue por vez.  SOLICITE ATENCIN MDICA SI:    Siente dolor en la espalda.   Le sube la fiebre.   Los sntomas no mejoran luego de 3 das.  SOLICITE ATENCIN MDICA DE INMEDIATO SI:    Siente dolor intenso en la espalda o en la zona inferior del abdomen.   Comienza a sentir escalofros.   Tiene nuseas o vmitos.   Tiene una sensacin continua de quemazn o molestias al orinar.  ASEGRESE DE QUE:    Comprende estas instrucciones.   Controlar su enfermedad.   Solicitar ayuda de inmediato si no mejora o si empeora.  Document Released: 12/19/2004 Document Revised: 09/10/2011  [employer] Patient Information 2013 [employer].

## 2012-03-13 NOTE — Progress Notes (Signed)
  Subjective:    Patient ID: Christie Holder, female    DOB: [birth year], 41 y.o.   MRN: [id number]  HPI This is a 41 y.o. female who is s/p C/S on 12/19/11 who presents with c/o dysuria for several days. States has had a lot of UTIs, moreso during pregnancy. Plans to establish care with a Urologist when her insurance starts, to investigate this. Denies fever. Has some low back pain but no CVA tenderness.    Review of Systems See HPI    Objective:   Physical Exam  Constitutional: She is oriented to person, place, and time. She appears well-developed and well-nourished. No distress.  Cardiovascular: Normal rate.   Pulmonary/Chest: Effort normal.  Abdominal: Soft. She exhibits no distension and no mass. There is tenderness (over bladder and low back). There is no rebound and no guarding.  Musculoskeletal: Normal range of motion.  Neurological: She is alert and oriented to person, place, and time.  Skin: Skin is warm and dry.  Psychiatric: She has a normal mood and affect.   Results for orders placed in visit on 03/13/12 (from the past 24 hour(s))  POCT URINALYSIS DIP (DEVICE)     Status: Abnormal   Collection Time   03/13/12 10:17 AM      Component Value Range   Glucose, UA NEGATIVE  NEGATIVE mg/dL   Bilirubin Urine NEGATIVE  NEGATIVE   Ketones, ur NEGATIVE  NEGATIVE mg/dL   Specific Gravity, Urine 1.020  1.005 - 1.030   Hgb urine dipstick TRACE (*) NEGATIVE   pH 6.0  5.0 - 8.0   Protein, ur NEGATIVE  NEGATIVE mg/dL   Urobilinogen, UA 0.2  0.0 - 1.0 mg/dL   Nitrite NEGATIVE  NEGATIVE   Leukocytes, UA TRACE (*) NEGATIVE      Assessment & Plan:  A:  Probable UTI   P:  Will send urine to culture       Rx septra DS for 7 days and Pyridium PRN       Followup with primary doctor and urologist

## 2012-03-17 LAB — URINE CULTURE

## 2012-03-20 ENCOUNTER — Telehealth: Payer: Self-pay | Admitting: Obstetrics and Gynecology

## 2012-03-20 MED ORDER — CEPHALEXIN 500 MG PO CAPS: 500.0000 mg | ORAL_CAPSULE | Freq: Three times a day (TID) | ORAL | Status: DC

## 2012-03-20 NOTE — Addendum Note (Signed)
Addended by: Aviva Signs on: 03/20/2012 07:12 AM   Modules accepted: Orders

## 2012-03-20 NOTE — Telephone Encounter (Addendum)
Message copied by Toula Moos on Fri Mar 20, 2012  9:08 AM   ------Called patient and notified of Urine culture result from Wynelle Bourgeois and that Keflex was sent to her pharmacy ready for pick up. Interpreter used: Tobi Bastos. Patient states understanding and satisfied.         Message from: [place], [place]      Created: Fri Mar 20, 2012  7:12 AM      Regarding: Need extra tx for UTI       Has 2 organisms in urine            Was treated with Septra which treats one bug, but other is resistant.            I put in order for Keflex to cover both            Let her know please      thanks

## 2012-08-10 ENCOUNTER — Ambulatory Visit: Payer: Self-pay | Admitting: Family Medicine

## 2012-09-23 ENCOUNTER — Ambulatory Visit (INDEPENDENT_AMBULATORY_CARE_PROVIDER_SITE_OTHER): Payer: BC Managed Care – PPO | Admitting: Obstetrics & Gynecology

## 2012-09-23 VITALS — BP 108/69 | HR 61 | Temp 98.4°F | Ht 64.0 in | Wt 224.0 lb

## 2012-09-23 DIAGNOSIS — Z309 Encounter for contraceptive management, unspecified: Secondary | ICD-10-CM

## 2012-09-23 DIAGNOSIS — N912 Amenorrhea, unspecified: Secondary | ICD-10-CM

## 2012-09-23 MED ORDER — NORETHINDRONE 0.35 MG PO TABS: 1.0000 | ORAL_TABLET | Freq: Every day | ORAL | Status: DC

## 2012-09-23 NOTE — Patient Instructions (Signed)
Regrese a la clinica cuando tenga su cita. Si tiene problemas o preguntas, llama a la clinica o vaya a la sala de emergencia al [facility].    

## 2012-09-24 ENCOUNTER — Encounter: Payer: Self-pay | Admitting: Obstetrics & Gynecology

## 2012-09-24 NOTE — Progress Notes (Signed)
GYNECOLOGY CLINIC PROGRESS NOTE  History:  42 y.o. [id number] here today for discussion about obtaining a bilateral tubal sterilization and she is also worried about amenorrhea since her delivery in 11/2011.  Patient is Spanish-speaking only, Spanish interpreter present for this encounter.  Patient is breastfeeding exclusively, uses condoms for contraception.  No other concerns.  The following portions of the patient's history were reviewed and updated as appropriate: allergies, current medications, past family history, past medical history, past social history, past surgical history and problem list.  Normal pap in 06/2011  Review of Systems:  Pertinent items are noted in HPI.  Objective:  BP 108/69  Pulse 61  Temp(Src) 98.4 F (36.9 C) (Oral)  Ht 5\' 4"  (1.626 m)  Wt 224 lb (101.606 kg)  BMI 38.43 kg/m2  Breastfeeding? Yes Physical Exam deferred UHCG negative  Assessment & Plan:  Patient is likely having lactational amenorrhea.  Will evaluate it further if it persists after she stops breastfeeding. Discussed BTS in detail, also discussed price of procedure.  Patient will get Medicaid in a few months and will rather wait to get this procedure done when she gets Medicaid as she does not want to have a large co-pay.  She was adcised to call when she gets Medicaid as she will need 30 day papers and to schedule the procedure. Routine preventative health maintenance measures emphasized

## 2013-01-26 ENCOUNTER — Ambulatory Visit (INDEPENDENT_AMBULATORY_CARE_PROVIDER_SITE_OTHER): Payer: BC Managed Care – PPO | Admitting: *Deleted

## 2013-01-26 DIAGNOSIS — Z23 Encounter for immunization: Secondary | ICD-10-CM

## 2013-05-21 ENCOUNTER — Encounter (HOSPITAL_COMMUNITY): Payer: Self-pay | Admitting: Emergency Medicine

## 2013-05-21 ENCOUNTER — Emergency Department (HOSPITAL_COMMUNITY)
Admission: EM | Admit: 2013-05-21 | Discharge: 2013-05-22 | Disposition: A | Discharge status: Home / Self Care | Payer: BC Managed Care – PPO | Attending: Emergency Medicine | Admitting: Emergency Medicine

## 2013-05-21 DIAGNOSIS — Z79899 Other long term (current) drug therapy: Secondary | ICD-10-CM | POA: Insufficient documentation

## 2013-05-21 DIAGNOSIS — O209 Hemorrhage in early pregnancy, unspecified: Secondary | ICD-10-CM | POA: Insufficient documentation

## 2013-05-21 DIAGNOSIS — O9989 Other specified diseases and conditions complicating pregnancy, childbirth and the puerperium: Secondary | ICD-10-CM | POA: Insufficient documentation

## 2013-05-21 DIAGNOSIS — R109 Unspecified abdominal pain: Secondary | ICD-10-CM | POA: Insufficient documentation

## 2013-05-21 DIAGNOSIS — Z792 Long term (current) use of antibiotics: Secondary | ICD-10-CM | POA: Insufficient documentation

## 2013-05-21 DIAGNOSIS — Z8632 Personal history of gestational diabetes: Secondary | ICD-10-CM | POA: Insufficient documentation

## 2013-05-21 LAB — CBC WITH DIFFERENTIAL/PLATELET
BASOS PCT: 0 % (ref 0–1)
Basophils Absolute: 0 10*3/uL (ref 0.0–0.1)
EOS ABS: 0.1 10*3/uL (ref 0.0–0.7)
EOS PCT: 1 % (ref 0–5)
HEMATOCRIT: 37.1 % (ref 36.0–46.0)
HEMOGLOBIN: 12.9 g/dL (ref 12.0–15.0)
Lymphocytes Relative: 41 % (ref 12–46)
Lymphs Abs: 4 10*3/uL (ref 0.7–4.0)
MCH: 32.9 pg (ref 26.0–34.0)
MCHC: 34.8 g/dL (ref 30.0–36.0)
MCV: 94.6 fL (ref 78.0–100.0)
MONO ABS: 0.4 10*3/uL (ref 0.1–1.0)
MONOS PCT: 4 % (ref 3–12)
Neutro Abs: 5.3 10*3/uL (ref 1.7–7.7)
Neutrophils Relative %: 54 % (ref 43–77)
Platelets: 235 10*3/uL (ref 150–400)
RBC: 3.92 MIL/uL (ref 3.87–5.11)
RDW: 12.7 % (ref 11.5–15.5)
WBC: 9.7 10*3/uL (ref 4.0–10.5)

## 2013-05-21 LAB — BASIC METABOLIC PANEL
BUN: 9 mg/dL (ref 6–23)
CO2: 23 mEq/L (ref 19–32)
CREATININE: 0.63 mg/dL (ref 0.50–1.10)
Calcium: 9.3 mg/dL (ref 8.4–10.5)
Chloride: 97 mEq/L (ref 96–112)
Glucose, Bld: 86 mg/dL (ref 70–99)
Potassium: 3.7 mEq/L (ref 3.7–5.3)
Sodium: 137 mEq/L (ref 137–147)

## 2013-05-21 LAB — HCG, SERUM, QUALITATIVE: Preg, Serum: POSITIVE — AB

## 2013-05-21 NOTE — ED Provider Notes (Signed)
CSN: [id number]     Arrival date & time 05/21/13  1723 History   First MD Initiated Contact with Patient 05/21/13 2224     Chief Complaint  Patient presents with  . heavy menstraul cycle    [id number]  HPI Comments: Pt noticed a very large clot with blood running down her legs today which obviously concerned her.  She was feeling lightheaded.  Patient is a 43 y.o. female presenting with vaginal bleeding. The history is provided by the patient.  Vaginal Bleeding Quality:  Bright red and clots Severity:  Severe Onset quality:  Sudden Duration:  1 day Timing:  Intermittent Progression:  Improving Menstrual history:  Regular Relieved by:  Nothing Worsened by:  Nothing tried Associated symptoms: abdominal pain   Associated symptoms: no dysuria, no fever and no nausea   Risk factors: no bleeding disorder   LMP last month.  Past Medical History  Diagnosis Date  . Gestational diabetes 10/10/2011   Past Surgical History  Procedure Laterality Date  . No past surgeries    . Cesarean section  12/19/2011    Procedure: CESAREAN SECTION;  Surgeon: Willodean Rosenthalarolyn Harraway-Smith, MD;  Location: [facility];  Service: Obstetrics;  Laterality: N/A;   Family History  Problem Relation Age of Onset  . Diabetes Maternal Grandmother    History  Substance Use Topics  . Smoking status: Never Smoker   . Smokeless tobacco: Never Used  . Alcohol Use: No   OB History   Grav Para Term Preterm Abortions TAB SAB Ect Mult Living   8 7 7  1  1   7      Review of Systems  Constitutional: Negative for fever.  Gastrointestinal: Positive for abdominal pain. Negative for nausea.  Genitourinary: Positive for vaginal bleeding. Negative for dysuria.  All other systems reviewed and are negative.      Allergies  Review of patient's allergies indicates no known allergies.  Home Medications   Current Outpatient Rx  Name  Route  Sig  Dispense  Refill  . cephALEXin (KEFLEX) 500 MG capsule   Oral   Take 1 capsule  (500 mg total) by mouth 3 (three) times daily.   21 capsule   0   . ibuprofen (ADVIL,MOTRIN) 600 MG tablet   Oral   Take 1 tablet (600 mg total) by mouth every 6 (six) hours.   30 tablet   1   . norethindrone (ORTHO MICRONOR) 0.35 MG tablet   Oral   Take 1 tablet (0.35 mg total) by mouth daily. Start in 3 weeks (01/12/12)   1 Package   11   . phenazopyridine (PYRIDIUM) 100 MG tablet   Oral   Take 1 tablet (100 mg total) by mouth 3 (three) times daily as needed for pain.   10 tablet   0   . Prenatal Vit-Fe Fumarate-FA (PRENATAL MULTIVITAMIN) TABS   Oral   Take 1 tablet by mouth daily.         Marland Kitchen. sulfamethoxazole-trimethoprim (BACTRIM DS,SEPTRA DS) 800-160 MG per tablet   Oral   Take 1 tablet by mouth 2 (two) times daily.   14 tablet   0    BP 116/70  Pulse 67  Temp(Src) 98.3 F (36.8 C) (Oral)  Resp 18  SpO2 100%  LMP 05/21/2013 Physical Exam  Nursing note and vitals reviewed. Constitutional: She appears well-developed and well-nourished. No distress.  HENT:  Head: Normocephalic and atraumatic.  Right Ear: External ear normal.  Left Ear: External ear normal.  Eyes: Conjunctivae are normal. Right eye exhibits no discharge. Left eye exhibits no discharge. No scleral icterus.  Neck: Neck supple. No tracheal deviation present.  Cardiovascular: Normal rate, regular rhythm and intact distal pulses.   Pulmonary/Chest: Effort normal and breath sounds normal. No stridor. No respiratory distress. She has no wheezes. She has no rales.  Abdominal: Soft. Bowel sounds are normal. She exhibits no distension. There is no tenderness. There is no rebound and no guarding.  Genitourinary: Uterus is tender. Cervix exhibits no motion tenderness and no discharge. Right adnexum displays no mass and no tenderness. Left adnexum displays no mass and no tenderness. No tenderness around the vagina. No signs of injury around the vagina.  Clots in the vault, no active bleeding during exam, os  closed  Musculoskeletal: She exhibits no edema and no tenderness.  Neurological: She is alert. She has normal strength. No cranial nerve deficit (no facial droop, extraocular movements intact, no slurred speech) or sensory deficit. She exhibits normal muscle tone. She displays no seizure activity. Coordination normal.  Skin: Skin is warm and dry. No rash noted.  Psychiatric: She has a normal mood and affect.    ED Course  Procedures (including critical care time) Labs Review Labs Reviewed  HCG, SERUM, QUALITATIVE - Abnormal; Notable for the following:    Preg, Serum POSITIVE (*)    All other components within normal limits  GC/CHLAMYDIA PROBE AMP  CBC WITH DIFFERENTIAL  BASIC METABOLIC PANEL  HCG, QUANTITATIVE, PREGNANCY  ABO/RH   Imaging Review No results found.   MDM  1209 Pregnancy test is positive with vaginal bleeding.  Pt is comfortable.   ecoptic versus threatened ab.  Will [place] and monitor.  Will turn over to Dr Preston Fleeting pending Sharene Butters and [place].    Celene Kras, MD 05/22/13 [phone]

## 2013-05-21 NOTE — ED Notes (Signed)
Per pt, states heavy bleeding since 12pm today-states not normal

## 2013-05-21 NOTE — ED Notes (Signed)
Per EMS, states pt is having heavy period, with clots

## 2013-05-21 NOTE — ED Notes (Signed)
Pt c/o heavy period bleeding since this afternoon. Pt sts she was at the grocery store, went to bathroom and blood was running down her legs and when she urinated a large clot came out. Pt started period 2 days ago. Pt c/o dizziness at the time and intermittent dizziness since then. Pt sts she is still bleeding heavily. Pt's current pad does not have much blood on it. A&Ox4. NAD noted.

## 2013-05-22 ENCOUNTER — Emergency Department (HOSPITAL_COMMUNITY): Payer: BC Managed Care – PPO

## 2013-05-22 LAB — GC/CHLAMYDIA PROBE AMP
CT Probe RNA: NEGATIVE
GC Probe RNA: NEGATIVE

## 2013-05-22 LAB — ABO/RH: ABO/RH(D): O POS

## 2013-05-22 LAB — HCG, QUANTITATIVE, PREGNANCY: hCG, Beta Chain, Quant, S: 1083 m[IU]/mL — ABNORMAL HIGH (ref <5)

## 2013-05-22 NOTE — Discharge Instructions (Signed)
Amenaza de aborto  (Threatened Miscarriage)  La hemorragia en las primeras 20 semanas de embarazo es algo frecuente. Se denomina amenaza de aborto Es un problema del embarazo que ocurre antes de la vigésima semana y que sugiere la probabilidad de que ocurra un aborto espontáneo. Generalmente esta hemorragia se detiene con reposo o con disminución de las actividades, según le ha sugerido el profesional que la asiste, y el embarazo continúa sin mayores problemas. Le indicarán que no mantenga relaciones sexuales, no tenga orgasmos ni use tampones hasta que la autoricen. En algunos casos la amenaza de aborto progresará hasta el aborto completo o incompleto. En algunos casos puede ser necesario un tratamiento adicional, en otros casos no. Algunos abortos ocurren antes que la mujer note que no ha menstruado y de que sepa que está embarazada.  Un aborto ocurre en el 15% al 20% de todos los embarazos y generalmente durante las primeras 13 semanas. En la mayoría de los casos se desconoce la causa exacta. Es una manera en que la naturaliza pone fin a un embarazo anormal o que no llegará a término. Algunas de las cosas que ponen en riesgo el embarazo son:  · Los cambios hormonales.  · Infección o tumores en el útero.  · Enfermedades crónicas, por ejemplo la diabetes, especialmente si no se ha controlado.  · Forma anormal del útero.  · Fibromas en el útero  · Cérvix incompetente (es demasiado débil para contener al bebé)  · El consumo de cigarrillos.  · Beber alcohol en exceso. Lo mejor es abstenerse de beber alcohol durante el embarazo.  · El consumo de drogas  TRATAMIENTO  No es necesario realizar un tratamiento adicional cuando el aborto es completo y todos los componentes de la concepción (todos los tejidos) se han eliminado. Si ha eliminado tejidos, consérvelos en un recipiente y llévelos al médico para que los evalúe. Si el aborto es incompleto (partes del feto o de la placenta quedan adentro) será necesario un  tratamiento adicional. La razón más frecuente para realizar un tratamiento es el sangrado (hemorragia) continuo debido a que los tejidos no se han eliminado completamente. Esto sucede cuando el aborto es incompleto. También puede suceder que los tejidos que no se han expulsado se infecten. El tratamiento consiste en la dilatación y curetaje (remoción de los productos del embarazo que pudieran quedar en el útero). Se realizará simplemente por medio de la succión (curetaje por succión) o por un raspado simple del interior del útero. Podrá llevarse a cabo en el hospital o en el consultorio del profesional. Sólo se lleva a cabo cuando el profesional se asegura que no hay posibilidades de que el embarazo llegue a término. Esto se determina por medio del examen físico, la prueba de embarazo negativa, el recuento hormonal y el ultrasonido que confirme la muerte del feto.  El aborto generalmente es una situación emocionalmente difícil para los padres. No es por su culpa ni la de su pareja. No ocurre por conductas inapropiadas por parte suya o de su compañero. Casi todos los abortos se producen porque el embarazo ha comenzado de un modo incorrecto. Al menos la mitad de estos embarazos presenta anormalidades cromosómicas. Casi nunca se trata de un trastorno congénito. En otros puede haber problemas de desarrollo en el feto o en la placenta. Esto no siempre se revela, aún cuando se estudien los productos del aborto con el microscopio. No se sienta culpable y probablemente no podría haber evitado que esto ocurra. Si tiene problemas emocionales debido a   este problema, convérselo con el profesional y solicite ayuda psicológico antes de un nuevo embarazo. Casi siempre puede tratar de embarazarse nuevamente tan pronto el profesional la autorice.  INSTRUCCIONES PARA EL CUIDADO DOMICILIARIO  · El profesional le indicará reposo, según la importancia de la hemorragia y los dolores que presente. Probablemente sólo la autorice a  levantarse para ir al baño. También podrá autorizarla a realizar una actividad ligera. En este momento usted necesitará hacer algunos arreglos para que otra persona se ocupe del cuidado de los niños y de otras responsabilidades adicionales.  · Lleve un registro de la cantidad y la saturación de las toallas higiénicas que utiliza cada día. Anote esta información.  · NO USE TAMPONES. No se haga duchas vaginales ni tenga relaciones sexuales u orgasmos hasta que el médico la autorice.  · Puede ser que le indiquen una cita para un seguimiento en el que volverán a evaluar el estado de su embarazo y le repetirán las pruebas de sangre. Concurra para una nueva evaluación dentro de 2 días y nuevamente dentro de 4 a 6 semanas. Es muy importante que realice el seguimiento en el momento que le han indicado.  · Si su grupo sanguíneo es Rh negativo y el del padre es Rh positivo, o si no conoce el grupo sanguíneo del padre, le indicarán una inyección (inmunoglobulina Rh) para prevenir los anticuerpos anormales que pueden desarrollarse y afectar al bebé en futuros embarazos.  SOLICITE ATENCIÓN MÉDICA DE INMEDIATO SI:  · Siente calambres intensos en el estómago, en la espalda o en el abdomen.  · Siente un dolor intenso de comienzo súbito en la zona inferior del abdomen.  · Comienza a sentir escalofríos.  · La temperatura se eleva por encima de 101° F (38.3° C).  · Elimina coágulos o tejidos grandes. Guarde una muestra de esos tejidos para que el profesional lo inspeccione.  · La hemorragia aumenta o se siente mareada, débil o tiene episodios de desmayos.  · Tiene una pérdida de líquido por la vagina.  · Se desmaya. No puede mover el intestino. Podría tratarse de un embarazo ectópico.  Document Released: 12/19/2004 Document Revised: 06/03/2011  [employer]® Patient Information ©2014 [employer].

## 2013-05-22 NOTE — ED Provider Notes (Signed)
Patient initially seen and evaluated by Dr. Lynelle DoctorKnapp with pelvic pain and vaginal bleeding and positive pregnancy test. HCG levels come back just over 1000 and pelvic ultrasound shows no IUP. Abdominal exam shows the abdomen is soft and nontender. Her hemoglobin is normal and heart rate is normal. No evidence of ruptured ectopic pregnancy. Case is discussed with Dr. love of OB/GYN service, and [facility] [place] in 2 days for repeat hCG level.  Dione Boozeavid Jetty Berland, MD 05/22/13 [phone]

## 2013-05-22 NOTE — ED Notes (Signed)
Bed: [id number] Expected date:  Expected time:  Means of arrival:  Comments: Hold for room 6

## 2013-06-04 ENCOUNTER — Encounter: Payer: Self-pay | Admitting: Family Medicine

## 2013-06-04 ENCOUNTER — Ambulatory Visit (INDEPENDENT_AMBULATORY_CARE_PROVIDER_SITE_OTHER): Payer: BC Managed Care – PPO | Admitting: Family Medicine

## 2013-06-04 VITALS — BP 121/74 | HR 67 | Temp 97.9°F | Ht 60.0 in | Wt 220.5 lb

## 2013-06-04 DIAGNOSIS — O039 Complete or unspecified spontaneous abortion without complication: Secondary | ICD-10-CM

## 2013-06-04 LAB — CBC
HEMATOCRIT: 36.7 % (ref 36.0–46.0)
Hemoglobin: 12.8 g/dL (ref 12.0–15.0)
MCH: 32.9 pg (ref 26.0–34.0)
MCHC: 34.9 g/dL (ref 30.0–36.0)
MCV: 94.3 fL (ref 78.0–100.0)
PLATELETS: 268 10*3/uL (ref 150–400)
RBC: 3.89 MIL/uL (ref 3.87–5.11)
RDW: 13.5 % (ref 11.5–15.5)
WBC: 9 10*3/uL (ref 4.0–10.5)

## 2013-06-04 NOTE — Progress Notes (Signed)
Subjective:     Patient ID: Christie Holder, female   DOB: [birth year], 43 y.o.   MRN: [id number]  HPI Pt was seen in ED on 2/27 for vaginal bleeding and was diagnosed with a likely active miscarriage. She reports that she has had less bleeding since that time and it has tapered off. Pt reports that she was having severe LLQ pain that has improved but still occassional comes and goes. Pt has 7 children and does not desire additional children. Pt has been using micronor for contraception.  Review of Systems Pt states she has felt tired and occassional gets dizzy, but feels strong when she is working out. Pt denies CP, sob, light headedness or other compalints at this time.    Objective:   Physical Exam Filed Vitals:   06/04/13 1055  BP: 121/74  Pulse: 67  Temp: 97.9 F (36.6 C)  VSS NAD ABD mild point tenderness in LLQ, NTTP otherwise, no other abdominal finding No CMT, no bleeding in vagina, mild white discharge, mild left adenexal tenderness  Recent Results (from the past 2160 hour(s))  CBC WITH DIFFERENTIAL     Status: None   Collection Time    05/21/13 10:40 PM      Result Value Ref Range   WBC 9.7  4.0 - 10.5 K/uL   RBC 3.92  3.87 - 5.11 MIL/uL   Hemoglobin 12.9  12.0 - 15.0 g/dL   HCT 37.1  36.0 - 46.0 %   MCV 94.6  78.0 - 100.0 fL   MCH 32.9  26.0 - 34.0 pg   MCHC 34.8  30.0 - 36.0 g/dL   RDW 12.7  11.5 - 15.5 %   Platelets 235  150 - 400 K/uL   Neutrophils Relative % 54  43 - 77 %   Neutro Abs 5.3  1.7 - 7.7 K/uL   Lymphocytes Relative 41  12 - 46 %   Lymphs Abs 4.0  0.7 - 4.0 K/uL   Monocytes Relative 4  3 - 12 %   Monocytes Absolute 0.4  0.1 - 1.0 K/uL   Eosinophils Relative 1  0 - 5 %   Eosinophils Absolute 0.1  0.0 - 0.7 K/uL   Basophils Relative 0  0 - 1 %   Basophils Absolute 0.0  0.0 - 0.1 K/uL  BASIC METABOLIC PANEL     Status: None   Collection Time    05/21/13 10:40 PM      Result Value Ref Range   Sodium 137  137 - 147 mEq/L   Potassium 3.7  3.7  - 5.3 mEq/L   Chloride 97  96 - 112 mEq/L   CO2 23  19 - 32 mEq/L   Glucose, Bld 86  70 - 99 mg/dL   BUN 9  6 - 23 mg/dL   Creatinine, Ser 0.63  0.50 - 1.10 mg/dL   Calcium 9.3  8.4 - 10.5 mg/dL   GFR calc non Af Amer >90  >90 mL/min   GFR calc Af Amer >90  >90 mL/min   Comment: (NOTE)     The eGFR has been calculated using the CKD EPI equation.     This calculation has not been validated in all clinical situations.     eGFR's persistently <90 mL/min signify possible Chronic Kidney     Disease.  HCG, SERUM, QUALITATIVE     Status: Abnormal   Collection Time    05/21/13 10:40 PM      Result  Value Ref Range   Preg, Serum POSITIVE (*) NEGATIVE   Comment:            THE SENSITIVITY OF THIS     METHODOLOGY IS >10 mIU/mL.  GC/CHLAMYDIA PROBE AMP     Status: None   Collection Time    05/21/13 11:14 PM      Result Value Ref Range   CT Probe RNA NEGATIVE  NEGATIVE   GC Probe RNA NEGATIVE  NEGATIVE   Comment: (NOTE)                                                                                               **Normal Reference Range: Negative**          Assay performed using the Gen-Probe APTIMA COMBO2 (R) Assay.     Acceptable specimen types for this assay include APTIMA Swabs (Unisex,     endocervical, urethral, or vaginal), first void urine, and ThinPrep     liquid based cytology samples.     Performed at [facility]  HCG, QUANTITATIVE, PREGNANCY     Status: Abnormal   Collection Time    05/22/13 12:08 AM      Result Value Ref Range   hCG, Beta Chain, Quant, S 1083 (*) <5 mIU/mL  ABO/RH     Status: None   Collection Time    05/22/13 12:40 AM      Result Value Ref Range   ABO/RH(D) O POS     CLINICAL DATA: Early pregnancy, vaginal bleeding, passed a large  clot, beta hcg is 1083  EXAM:  TRANSVAGINAL OB ULTRASOUND  TECHNIQUE:  Transvaginal ultrasound was performed for complete evaluation of the  gestation as well as the maternal uterus, adnexal regions, and   pelvic cul-de-sac.  COMPARISON: None.  FINDINGS:  Intrauterine gestational sac: Not visualized  Yolk sac: Not visualized  Embryo: Not visualized  Maternal uterus/adnexae: Both ovaries measure about 3 x 2 x 1.5 cm  with no evidence of cyst or mass. Uterus is 8 x 4.5 x 6 cm. The  endometrium is thickened to 15 mm and is markedly heterogeneous and  hypervascular. There is a small amount of endometrial fluid which  does not conform in appearance with a normal gestational sac. There  is no free fluid within the pelvis.  IMPRESSION:  Findings appear most consistent with spontaneous abortion in  progress. There is no visualized evidence of ectopic pregnancy. Less  likely, findings may be related to very early pregnancy that is too  early to image. Findings are suspicious but not yet definitive for  failed pregnancy. Recommend follow-up US in 10-14 days for  definitive diagnosis. This recommendation follows SRU consensus  guidelines: Diagnostic Criteria for Nonviable Pregnancy Early in the  First Trimester. [facility] Med 2013; 373:4287-68.  Electronically Signed  By: Skipper Cliche M.D.  On: 05/22/2013 07:07      Assessment:     Christie Holder is a 43 y.o. [phone] with likely spontaneous miscarriage.  Pt now without bleeding and improved pain.  - repeat quant if <100 likely completed miscarriage - if  persistently elevated will repeat US - results will not be back until after the weekend, pt given strict return precautions that if she has severe pain, worsening bleeding, or fever to go to MAU - will have patient follow up in clinic in 2 weeks with surgeon to discuss tubal, will offer depo at that time to give good contraception. Recommend condoms for intercourse at this time.  Fredrik Rigger, MD OB Fellow

## 2013-06-05 LAB — HCG, QUANTITATIVE, PREGNANCY: HCG, BETA CHAIN, QUANT, S: 248.7 m[IU]/mL

## 2013-06-07 ENCOUNTER — Telehealth: Payer: Self-pay | Admitting: *Deleted

## 2013-06-07 NOTE — Telephone Encounter (Signed)
Message copied by Mannie StabileASH, Finbar Nippert A on Mon Jun 07, 2013  4:23 PM ------      Message from: Jolyn LentDOM, MICHAEL R      Created: Sun Jun 06, 2013 10:51 PM       Please have pt follow up bHCG in 2 weeks. Decreased from previous but not gone to <50.            Thank you. ------

## 2013-06-07 NOTE — Telephone Encounter (Signed)
Called patient and informed her of results. She is coming in on 06/18/13 for lab.

## 2013-06-18 ENCOUNTER — Other Ambulatory Visit: Payer: BC Managed Care – PPO

## 2013-06-23 ENCOUNTER — Ambulatory Visit (INDEPENDENT_AMBULATORY_CARE_PROVIDER_SITE_OTHER): Payer: BC Managed Care – PPO | Admitting: Obstetrics & Gynecology

## 2013-06-23 ENCOUNTER — Encounter: Payer: Self-pay | Admitting: Obstetrics & Gynecology

## 2013-06-23 VITALS — BP 99/66 | HR 73 | Temp 97.5°F | Ht 60.0 in | Wt 217.8 lb

## 2013-06-23 DIAGNOSIS — Z139 Encounter for screening, unspecified: Secondary | ICD-10-CM

## 2013-06-23 DIAGNOSIS — Z3009 Encounter for other general counseling and advice on contraception: Secondary | ICD-10-CM

## 2013-06-23 DIAGNOSIS — Z309 Encounter for contraceptive management, unspecified: Secondary | ICD-10-CM

## 2013-06-23 DIAGNOSIS — O039 Complete or unspecified spontaneous abortion without complication: Secondary | ICD-10-CM

## 2013-06-23 MED ORDER — NORETHINDRONE 0.35 MG PO TABS: 1.0000 | ORAL_TABLET | Freq: Every day | ORAL | Status: DC

## 2013-06-23 NOTE — Progress Notes (Signed)
Patient ID: Christie Holder, female   DOB: 1970-12-20, 43 y.o.   MRN: [id number] Subjective:   Patient ID: Christie Holder, female DOB: March 11, 1971, 43 y.o. MRN: [id number]  HPI  Pt was seen in ED on 2/27 for vaginal bleeding and was diagnosed with a likely active miscarriage. She reports that she has had less bleeding since that time and it has tapered off. Pt reports that she was having severe LLQ pain that has improved but still occassional comes and goes. Pt has 7 children and does not desire additional children. Pt has been using micronor for contraception.  Review of Systems  Pt states she has felt tired and occassional gets dizzy, but feels strong when she is working out. Pt denies CP, sob, light headedness or other compalints at this time.  Objective:   Physical Exam  Filed Vitals:    06/04/13 1055   BP:  121/74   Pulse:  67   Temp:  97.9 F (36.6 C)   VSS NAD  ABD mild point tenderness in LLQ, NTTP otherwise, no other abdominal finding  No CMT, no bleeding in vagina, mild white discharge, mild left adenexal tenderness  Recent Results (from the past 2160 hour(s))   CBC WITH DIFFERENTIAL Status: None    Collection Time    05/21/13 10:40 PM   Result  Value  Ref Range    WBC  9.7  4.0 - 10.5 K/uL    RBC  3.92  3.87 - 5.11 MIL/uL    Hemoglobin  12.9  12.0 - 15.0 g/dL    HCT  37.1  36.0 - 46.0 %    MCV  94.6  78.0 - 100.0 fL    MCH  32.9  26.0 - 34.0 pg    MCHC  34.8  30.0 - 36.0 g/dL    RDW  12.7  11.5 - 15.5 %    Platelets  235  150 - 400 K/uL    Neutrophils Relative %  54  43 - 77 %    Neutro Abs  5.3  1.7 - 7.7 K/uL    Lymphocytes Relative  41  12 - 46 %    Lymphs Abs  4.0  0.7 - 4.0 K/uL    Monocytes Relative  4  3 - 12 %    Monocytes Absolute  0.4  0.1 - 1.0 K/uL    Eosinophils Relative  1  0 - 5 %    Eosinophils Absolute  0.1  0.0 - 0.7 K/uL    Basophils Relative  0  0 - 1 %    Basophils Absolute  0.0  0.0 - 0.1 K/uL   BASIC METABOLIC PANEL Status: None    Collection Time    05/21/13 10:40 PM   Result  Value  Ref Range    Sodium  137  137 - 147 mEq/L    Potassium  3.7  3.7 - 5.3 mEq/L    Chloride  97  96 - 112 mEq/L    CO2  23  19 - 32 mEq/L    Glucose, Bld  86  70 - 99 mg/dL    BUN  9  6 - 23 mg/dL    Creatinine, Ser  0.63  0.50 - 1.10 mg/dL    Calcium  9.3  8.4 - 10.5 mg/dL    GFR calc non Af Amer  >90  >90 mL/min    GFR calc Af Amer  >90  >90 mL/min  Comment:  (NOTE)     The eGFR has been calculated using the CKD EPI equation.     This calculation has not been validated in all clinical situations.     eGFR's persistently <90 mL/min signify possible Chronic Kidney     Disease.   HCG, SERUM, QUALITATIVE Status: Abnormal    Collection Time    05/21/13 10:40 PM   Result  Value  Ref Range    Preg, Serum  POSITIVE (*)  NEGATIVE    Comment:      THE SENSITIVITY OF THIS     METHODOLOGY IS >10 mIU/mL.   GC/CHLAMYDIA PROBE AMP Status: None    Collection Time    05/21/13 11:14 PM   Result  Value  Ref Range    CT Probe RNA  NEGATIVE  NEGATIVE    GC Probe RNA  NEGATIVE  NEGATIVE    Comment:  (NOTE)         **Normal Reference Range: Negative**     Assay performed using the Gen-Probe APTIMA COMBO2 (R) Assay.     Acceptable specimen types for this assay include APTIMA Swabs (Unisex,     endocervical, urethral, or vaginal), first void urine, and ThinPrep     liquid based cytology samples.     Performed at [facility]   HCG, QUANTITATIVE, PREGNANCY Status: Abnormal    Collection Time    05/22/13 12:08 AM   Result  Value  Ref Range    hCG, Beta Chain, Quant, S  1083 (*)  <5 mIU/mL   ABO/RH Status: None    Collection Time    05/22/13 12:40 AM   Result  Value  Ref Range    ABO/RH(D)  O POS    CLINICAL DATA: Early pregnancy, vaginal bleeding, passed a large  clot, beta hcg is 1083  EXAM:  TRANSVAGINAL OB ULTRASOUND  TECHNIQUE:  Transvaginal ultrasound was performed for complete evaluation of the  gestation as well as the  maternal uterus, adnexal regions, and  pelvic cul-de-sac.  COMPARISON: None.  FINDINGS:  Intrauterine gestational sac: Not visualized  Yolk sac: Not visualized  Embryo: Not visualized  Maternal uterus/adnexae: Both ovaries measure about 3 x 2 x 1.5 cm  with no evidence of cyst or mass. Uterus is 8 x 4.5 x 6 cm. The  endometrium is thickened to 15 mm and is markedly heterogeneous and  hypervascular. There is a small amount of endometrial fluid which  does not conform in appearance with a normal gestational sac. There  is no free fluid within the pelvis.  IMPRESSION:  Findings appear most consistent with spontaneous abortion in  progress. There is no visualized evidence of ectopic pregnancy. Less  likely, findings may be related to very early pregnancy that is too  early to image. Findings are suspicious but not yet definitive for  failed pregnancy. Recommend follow-up US in 10-14 days for  definitive diagnosis. This recommendation follows SRU consensus  guidelines: Diagnostic Criteria for Nonviable Pregnancy Early in the  First Trimester. [facility] Med 2013; 580:9983-38.  Electronically Signed  By: Skipper Cliche M.D.  On: 05/22/2013 07:07  Assessment:   Christie Holder is a 43 y.o. [phone] with likely spontaneous miscarriage. Pt now without bleeding and improved pain.  - repeat quant if <100 likely completed miscarriage  - if persistently elevated will repeat US  - results will not be back until after the weekend, pt given strict return precautions that if she has severe pain,  worsening bleeding, or fever to go to [facility]U  - will have patient follow up in clinic in 2 weeks with surgeon to discuss tubal, will offer depo at that time to give good contraception. Recommend condoms for intercourse at this time.  Fredrik Rigger, MD  OB Fellow  Dr. Temple Pacini note reviewed. Patient has no bleeding or pain, suspect complete Ab will repeat HCG to confirm. Requests schedule LBTL. The  procedure and the risk of anesthesia, bleeding, infection, bowel and bladder injury, failure (1/200) and ectopic pregnancy were discussed and her questions were answered. The procedure will be scheduled as an outpatient. Will continue OCP until procedre done.  Woodroe Mode, MD 06/23/2013

## 2013-06-24 LAB — HCG, QUANTITATIVE, PREGNANCY: hCG, Beta Chain, Quant, S: 94.2 m[IU]/mL

## 2013-06-25 ENCOUNTER — Other Ambulatory Visit: Payer: Self-pay | Admitting: Obstetrics & Gynecology

## 2013-06-28 ENCOUNTER — Telehealth: Payer: Self-pay | Admitting: *Deleted

## 2013-06-28 NOTE — Telephone Encounter (Signed)
Message copied by Jill SideAY, DIANE L on Mon Jun 28, 2013  3:33 PM ------      Message from: Adam PhenixARNOLD, JAMES G      Created: Fri Jun 25, 2013  2:19 PM       C/w completed SAB and is scheduled for BTL ------

## 2013-06-29 NOTE — Telephone Encounter (Signed)
Called pt with Spanish interpreter, Okey RegalCarol, and informed pt that her BTL has been scheduled for 07/23/13 @ 1245 and that her hormone levels are going down like they should meaning that her body is getting rid of the products of conception like it should.  Pt stated "ok".

## 2013-07-01 IMAGING — US US OB FOLLOW-UP
1 series · 12 of 28 positions shown · non-contrast
Comparison: none

[Series 1: us ob follow up · 12 of 30 slices shown]
[im 2/30]
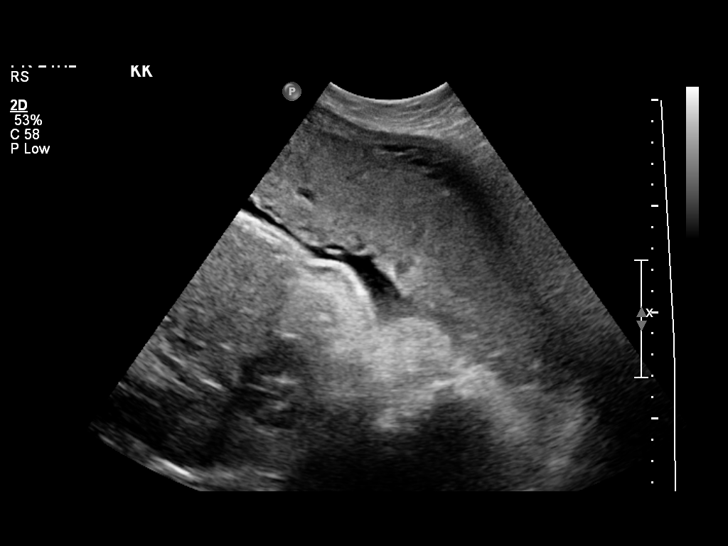
[im 4/30]
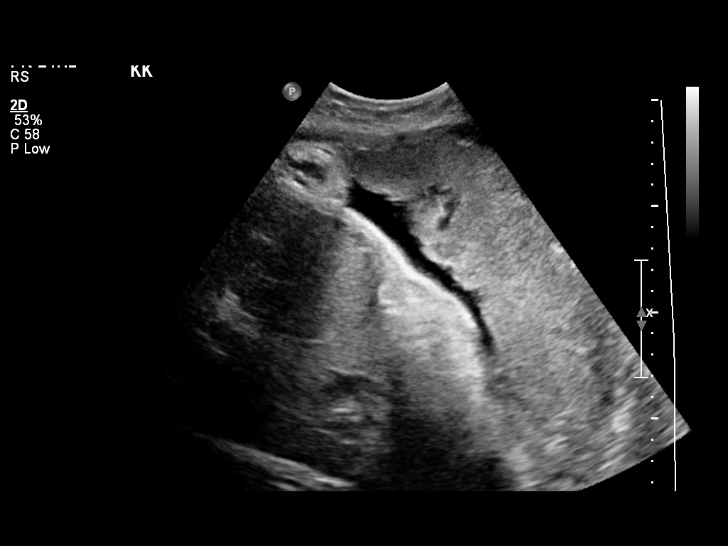
[im 6/30]
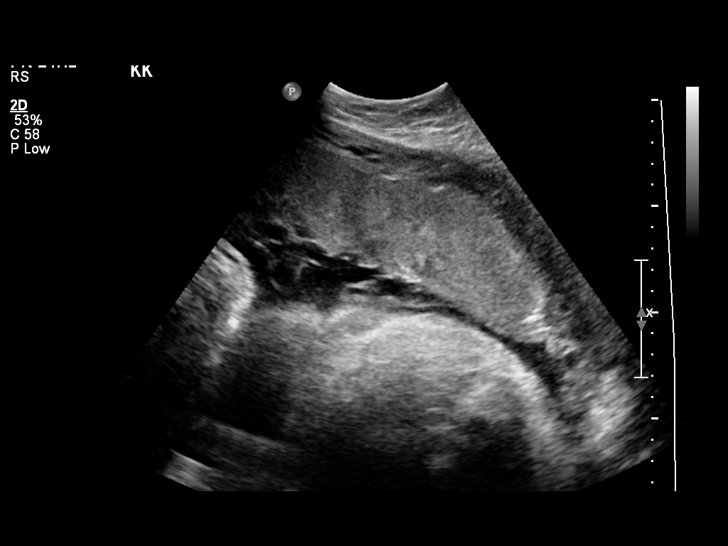
[im 9/30]
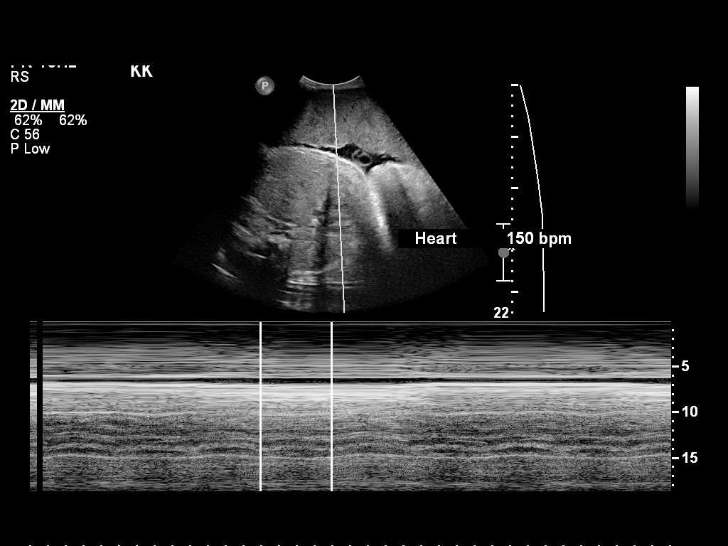
[im 11/30]
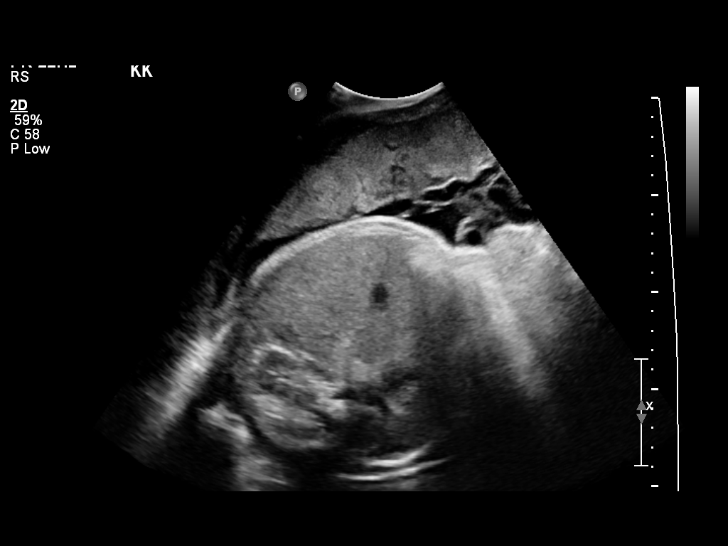
[im 13/30]
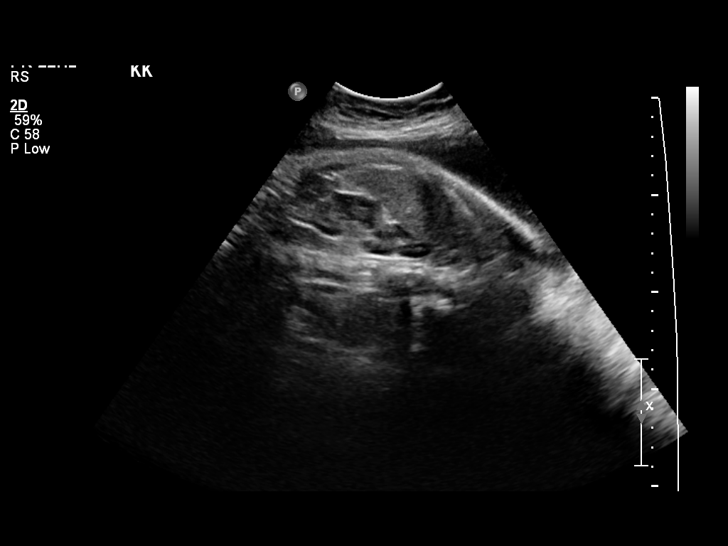
[im 17/30]
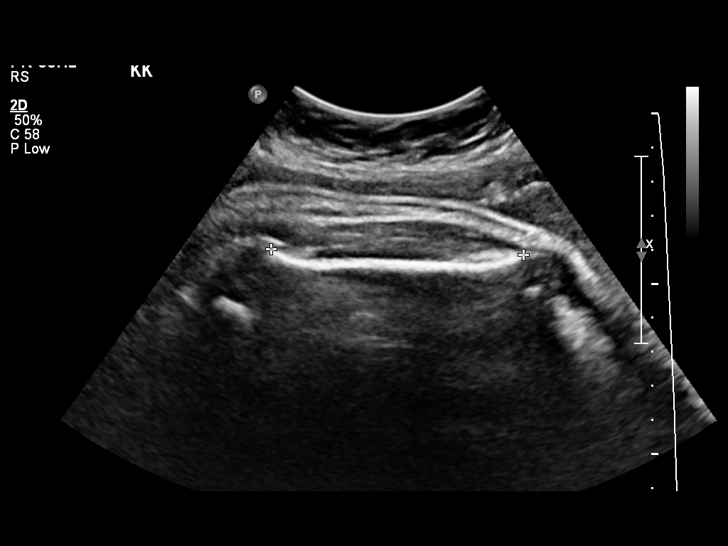
[im 19/30]
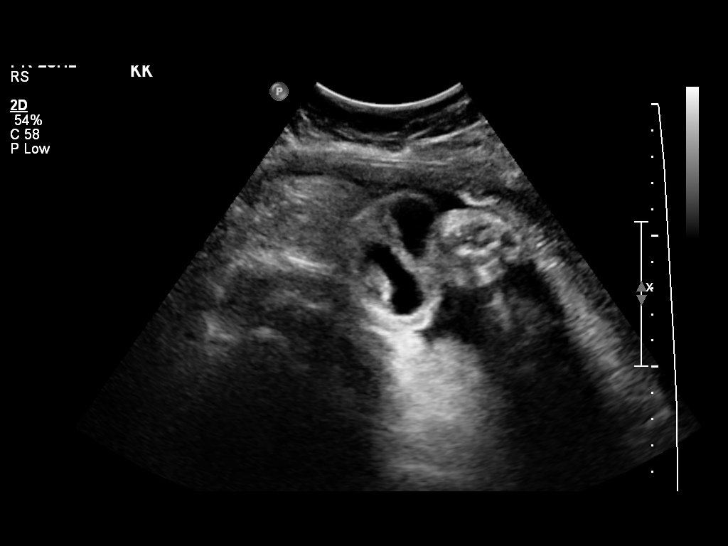
[im 21/30]
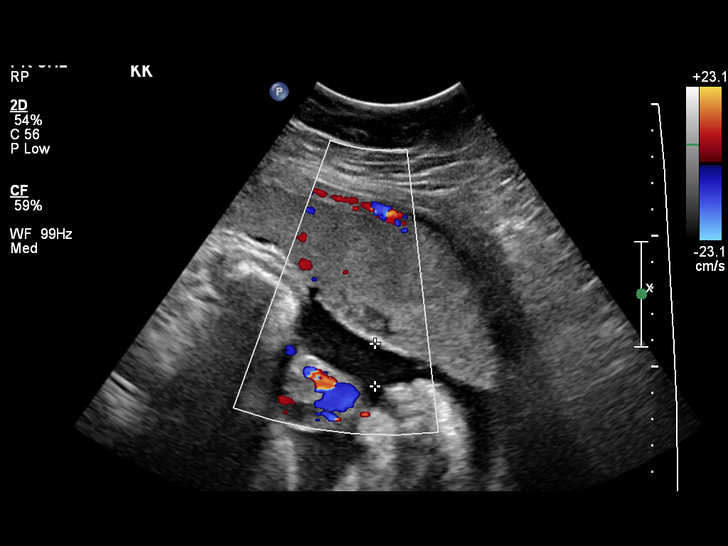
[im 24/30]
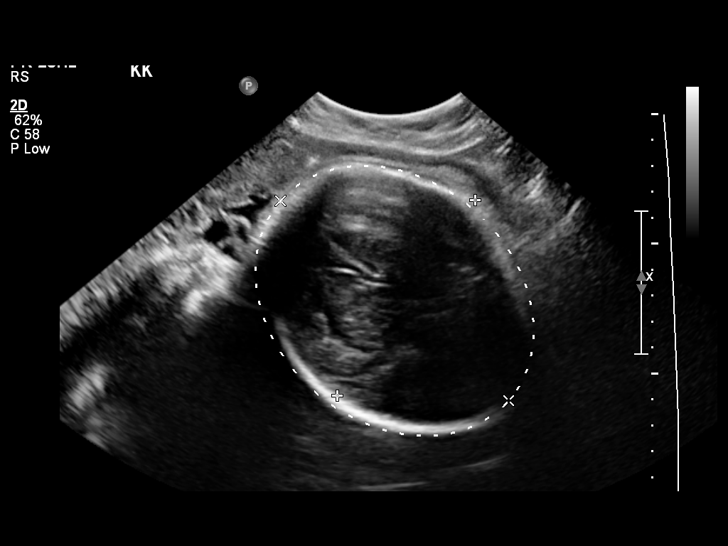
[im 26/30]
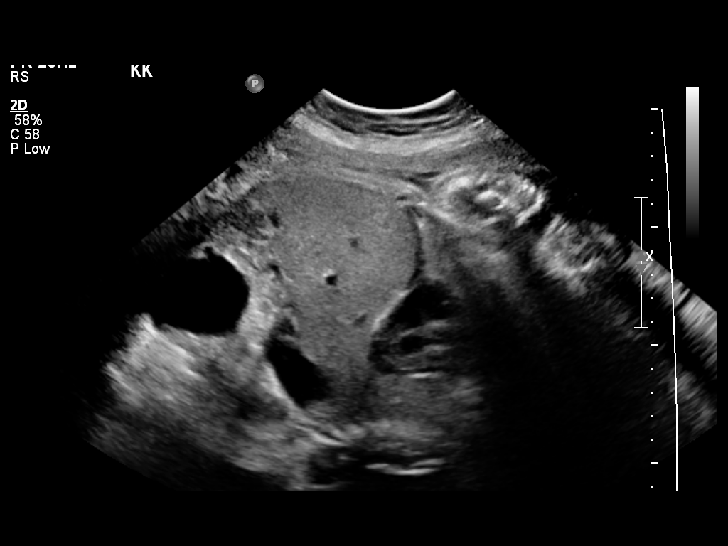
[im 28/30]
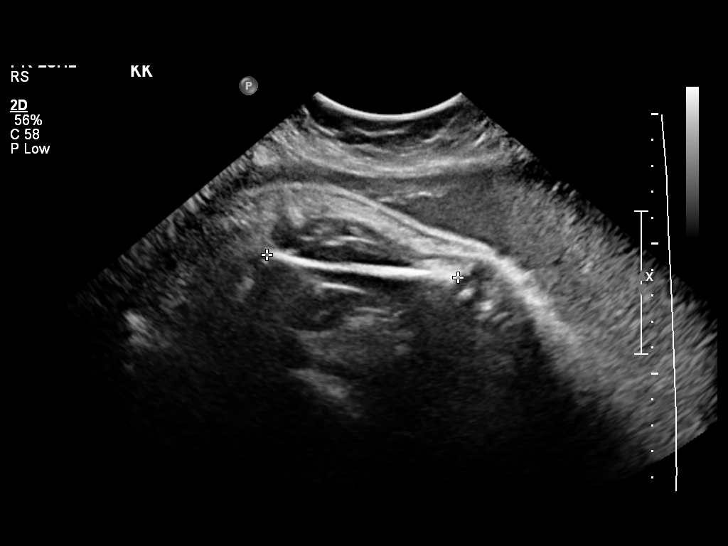

[12 of 28 positions shown; findings below may reference images not displayed]

OBSTETRICS REPORT
                      (Signed Final 12/16/2011 [DATE])

             PRINCE STEPHEN

Service(s) Provided

 US OB FOLLOW UP                                       76816.1
Indications

 Assess Fetal Growth / Estimated Fetal Weight
 Diabetes - Gestational, A2
 Size greater than dates (Large for gestational [AGE]
 Advanced maternal age (AMA), Multigravida
Fetal Evaluation

 Num Of Fetuses:    1
 Fetal Heart Rate:  150                         bpm
 Cardiac Activity:  Observed
 Presentation:      Cephalic
 Placenta:          Anterior, above cervical os

 Amniotic Fluid
 AFI FV:      Subjectively within normal limits
 AFI Sum:     10.62   cm      32   %Tile     Larg Pckt:   5.46   cm
 RUQ:   5.46   cm    RLQ:    1.13   cm    LUQ:   2.99    cm   LLQ:    1.04   cm
Biometry

 BPD:     92.1  mm    G. Age:   37w 3d                CI:         80.4   70 - 86
 OFD:    114.6  mm                                    FL/HC:      22.0   20.6 -

 HC:     334.8  mm    G. Age:   38w 2d       28  %    HC/AC:      0.79   0.87 -

 AC:     423.5  mm    G. Age:   N/A        > 97  %    FL/BPD:     79.9   71 - 87
 FL:      73.6  mm    G. Age:   37w 5d       33  %    FL/AC:      17.4   20 - 24

 Est. FW:    8688  gm      10 lb 11    > 90  %
                                 oz
Gestational Age

 Clinical EDD:  38w 4d                                        EDD:   12/26/11
 U/S Today:     37w 6d                                        EDD:   12/31/11
 Best:          38w 4d    Det. By:   Clinical EDD             EDD:   12/26/11
Anatomy
 Cranium:          Appears normal         Aortic Arch:      Basic anatomy
                                                            exam per order
 Fetal Cavum:      Appears normal         Ductal Arch:      Basic anatomy
                                                            exam per order
 Ventricles:       Previously seen        Diaphragm:        Appears normal
 Choroid Plexus:   Previously seen        Stomach:          Appears normal, left
                                                            sided
 Cerebellum:       Previously seen        Abdomen:          Appears normal
 Posterior Fossa:  Previously seen        Abdominal Wall:   Not well visualized
 Nuchal Fold:      Not applicable (>20    Cord Vessels:     Previously seen
                   wks GA)
 Face:             Not well visualized    Kidneys:          Appear normal
 Lips:             Not well visualized    Bladder:          Appears normal
 Palate:           Not well visualized    Spine:            Not well visualized
 Heart:            Previously seen        Lower             Not well visualized
                                          Extremities:
 RVOT:             Not well visualized    Upper             Not well visualized
                                          Extremities:
 LVOT:             Not well visualized
Impression

 Single living IUP with assigned GA of 38w 4d. EFW is >90th
 percentile for GA, and the fetal abdominal circumference is
 >97th percentile for GA, indicating the fetus is large-for-
 gestational age.
 Bilateral fetal scrotal hydroceles noted.
 Amniotic fluid volume is subjectively and objectively within
 normal limits.

## 2013-07-02 ENCOUNTER — Ambulatory Visit (HOSPITAL_COMMUNITY)
Admission: RE | Admit: 2013-07-02 | Discharge: 2013-07-02 | Disposition: A | Discharge status: Home / Self Care | Payer: BC Managed Care – PPO | Source: Ambulatory Visit | Attending: Obstetrics & Gynecology | Admitting: Obstetrics & Gynecology

## 2013-07-02 DIAGNOSIS — Z139 Encounter for screening, unspecified: Secondary | ICD-10-CM

## 2013-07-09 ENCOUNTER — Encounter (HOSPITAL_COMMUNITY): Payer: Self-pay | Admitting: Pharmacist

## 2013-07-14 ENCOUNTER — Encounter (HOSPITAL_COMMUNITY): Payer: Self-pay | Admitting: *Deleted

## 2013-07-16 ENCOUNTER — Inpatient Hospital Stay (HOSPITAL_COMMUNITY)
Admission: AD | Admit: 2013-07-16 | Discharge: 2013-07-16 | Disposition: A | Discharge status: Home / Self Care | Payer: BC Managed Care – PPO | Source: Ambulatory Visit | Attending: Obstetrics & Gynecology | Admitting: Obstetrics & Gynecology

## 2013-07-16 ENCOUNTER — Inpatient Hospital Stay (HOSPITAL_COMMUNITY): Payer: BC Managed Care – PPO

## 2013-07-16 ENCOUNTER — Encounter (HOSPITAL_COMMUNITY): Payer: Self-pay | Admitting: *Deleted

## 2013-07-16 DIAGNOSIS — O034 Incomplete spontaneous abortion without complication: Secondary | ICD-10-CM | POA: Insufficient documentation

## 2013-07-16 LAB — URINE MICROSCOPIC-ADD ON

## 2013-07-16 LAB — CBC
HEMATOCRIT: 35.9 % — AB (ref 36.0–46.0)
Hemoglobin: 12.3 g/dL (ref 12.0–15.0)
MCH: 32.6 pg (ref 26.0–34.0)
MCHC: 34.3 g/dL (ref 30.0–36.0)
MCV: 95.2 fL (ref 78.0–100.0)
Platelets: 219 10*3/uL (ref 150–400)
RBC: 3.77 MIL/uL — ABNORMAL LOW (ref 3.87–5.11)
RDW: 12.9 % (ref 11.5–15.5)
WBC: 10.1 10*3/uL (ref 4.0–10.5)

## 2013-07-16 LAB — URINALYSIS, ROUTINE W REFLEX MICROSCOPIC
Bilirubin Urine: NEGATIVE
GLUCOSE, UA: NEGATIVE mg/dL
Ketones, ur: NEGATIVE mg/dL
LEUKOCYTES UA: NEGATIVE
Nitrite: NEGATIVE
Protein, ur: NEGATIVE mg/dL
SPECIFIC GRAVITY, URINE: 1.02 (ref 1.005–1.030)
UROBILINOGEN UA: 0.2 mg/dL (ref 0.0–1.0)
pH: 6.5 (ref 5.0–8.0)

## 2013-07-16 LAB — HCG, QUANTITATIVE, PREGNANCY: HCG, BETA CHAIN, QUANT, S: 32 m[IU]/mL — AB (ref <5)

## 2013-07-16 MED ORDER — OXYCODONE-ACETAMINOPHEN 5-325 MG PO TABS
2.0000 | ORAL_TABLET | Freq: Once | ORAL | Status: DC
  Filled 2013-07-16: qty 2

## 2013-07-16 MED ORDER — OXYCODONE-ACETAMINOPHEN 5-325 MG PO TABS: 1.0000 | ORAL_TABLET | ORAL | Status: DC | PRN

## 2013-07-16 MED ORDER — MISOPROSTOL 200 MCG PO TABS
800.0000 ug | ORAL_TABLET | Freq: Once | ORAL | Status: AC
  Administered 2013-07-16: 800 ug via VAGINAL
  Filled 2013-07-16: qty 4

## 2013-07-16 MED ORDER — IBUPROFEN 600 MG PO TABS: 600.0000 mg | ORAL_TABLET | Freq: Four times a day (QID) | ORAL | Status: DC | PRN

## 2013-07-16 MED ORDER — KETOROLAC TROMETHAMINE 60 MG/2ML IM SOLN
60.0000 mg | Freq: Once | INTRAMUSCULAR | Status: AC
  Administered 2013-07-16: 60 mg via INTRAMUSCULAR
  Filled 2013-07-16: qty 2

## 2013-07-16 NOTE — Discharge Instructions (Signed)
Aborto incompleto °(Incomplete Miscarriage) °Un aborto espontáneo es la pérdida repentina de un bebé en gestación (feto) antes de la semana 20 del embarazo. En un aborto espontáneo, partes del feto o la placenta (alumbramiento) permanecen en el cuerpo.  °El aborto espontáneo puede ser una experiencia que afecte emocionalmente a la persona. Hable con su médico si tiene preguntas sobre el aborto espontáneo, el proceso de duelo y los planes futuros de embarazo. °CAUSAS  °· Algunos problemas cromosómicos pueden hacer imposible que el bebé se desarrolle normalmente. Los problemas con los genes o cromosomas del bebé son, en la mayoría de los casos, el resultado de errores que se producen, al azar, cuando el embrión se divide y crece. Estos problemas no se heredan de los padres. °· Infección en el cuello del útero. °· Problemas hormonales. °· Problemas en el cuello del útero, como tener un útero incompetente. Esto ocurre cuando los tejidos no son lo suficientemente fuertes como para contener el embarazo. °· Problemas del útero, como un útero con forma anormal, los fibromas o anormalidades congénitas. °· Ciertas enfermedades crónicas. °· No fume, no beba alcohol, ni consuma drogas. °· Traumatismos. °SÍNTOMAS  °· Sangrado o manchado vaginal, con o sin cólicos o dolor. °· Dolor o cólicos en el abdomen o en la cintura. °· Eliminación de líquido, tejidos o coágulos grandes por la vagina. °DIAGNÓSTICO  °El médico le hará un examen físico. También le indicará una ecografía para confirmar el aborto. Es posible que se realicen análisis de sangre. °TRATAMIENTO  °· Generalmente se realiza un procedimiento de dilatación y curetaje (D y C). Durante el procedimiento de dilatación y curetaje, el cuello del útero se abre (dilata) y se retira todo resto de tejido fetal o placentario del útero. °· Si hay una infección, le recetarán antibióticos. Posiblemente le receten otros medicamentos para reducir (contraer) el tamaño del útero si hay  mucha hemorragia. °· Si su tipo de sangre es Rh negativo y el del bebé es Rh positivo, necesitará una inyección de inmunoglobulina Rho(D). Esta inyección protegerá a los futuros bebés de tener problemas de compatibilidad Rh en futuros embarazos. °· Probablemente le indiquen reposo. Esto significa que debe quedarse en cama y levantarse únicamente para ir al baño. °INSTRUCCIONES PARA EL CUIDADO EN EL HOGAR  °· Haga reposo según las indicaciones del médico. °· Limite las actividades según las indicaciones del médico. Es posible que se le permita retomar las actividades livianas si no se le realizó un curetaje, pero necesitará tratamiento adicional. °· Lleve un registro de la cantidad de toallas sanitarias que usa por día. Observe cuán impregnadas (saturadas) están. Registre esta información. °· No  use tampones. °· No se haga duchas vaginales ni tenga relaciones sexuales hasta que el médico la autorice. °· Asista a todas las citas de seguimiento para una nueva evaluación y para continuar el tratamiento. °· Sólo tome medicamentos de venta libre o recetados para calmar el dolor, el malestar o bajar la fiebre, según las indicaciones de su médico. °· Tome los antibióticos como le indicó el médico. Asegúrese de que finaliza la prescripción completa aunque se sienta mejor. °SOLICITE ATENCIÓN MÉDICA DE INMEDIATO SI:  °· Siente calambres intensos en el estómago, en la espalda o en el abdomen. °· Le sube la fiebre sin motivo (asegúrese de registrar las cifras). °· Elimina coágulos grandes o tejidos (consérvelos para que el médico los analice). °· La hemorragia aumenta. °· Se siente mareada, débil o tiene episodios de desmayo. °ASEGÚRESE DE QUE:  °· Comprende estas instrucciones. °·   Controlar su afeccin.  Recibir ayuda de inmediato si no mejora o si empeora. Document Released: 03/11/2005 Document Revised: 12/30/2012 [facility] Patient Information 2014 [place], [place].  FACTS YOU SHOULD KNOW  WHAT IS AN EARLY PREGNANCY  FAILURE? Once the egg is fertilized with the sperm and begins to develop, it attaches to the lining of the uterus. This early pregnancy tissue may not develop into an embryo (the beginning stage of a baby). Sometimes an embryo does develop but does not continue to grow. These problems can be seen on ultrasound.   MANAGEMNT OF EARLY PREGNANCY FAILURE: About 4 out of 100 (0.25%) women will have a pregnancy loss in her lifetime.  One in five pregnancies is found to be an early pregnancy failure.  There are 3 ways to care for an early pregnancy failure:   (1) Surgery, (2) Medicine, (3) Waiting for you to pass the pregnancy on your own. The decision as to how to proceed after being diagnosed with and early pregnancy failure is an individual one.  The decision can be made only after appropriate counseling.  You need to weigh the pros and cons of the 3 choices. Then you can make the choice that works for you. SURGERY (D&E)   Procedure over in 1 day   Requires being put to sleep   Bleeding may be light   Possible problems during surgery, including injury to womb(uterus)   Care provider has more control Medicine (CYTOTEC)   The complete procedure may take days to weeks   No Surgery   Bleeding may be heavy at times   There may be drug side effects   Patient has more control Waiting   You may choose to wait, in which case your own body may complete the passing of the abnormal early pregnancy on its own in about 2-4 weeks   Your bleeding may be heavy at times   There is a small possibility that you may need surgery if the bleeding is too much or not all of the pregnancy has passed. CYTOTEC MANAGEMENT Prostaglandins (cytotec) are the most widely used drug for this purpose. They cause the uterus to cramp and contract. You will place the medicine yourself inside your vagina in the privacy of your home. Empting of the uterus should occur within 3 days but the process may continue for several weeks. The  bleeding may seem heavy at times. POSSIBLE SIDE EFFECTS FROM CYTOTEC   Nausea   Vomiting   Diarrhea Fever   Chills  Hot Flashes Side effects  from the process of the early pregnancy failure include:   Cramping  Bleeding   Headaches  Dizziness RISKS: This is a low risk procedure. Less than 1 in 100 women has a complication. An incomplete passage of the early pregnancy may occur. Also, Hemorrhage (heavy bleeding) could happen.  Rarely the pregnancy will not be passed completely. Excessively heavy bleeding may occur.  Your doctor may need to perform surgery to empty the uterus (D&E). Afterwards: Everybody will feel differently after the early pregnancy completion. You may have soreness or cramps for a day or two. You may have soreness or cramps for day or two.  You may have light bleeding for up to 2 weeks. You may be as active as you feel like being. If you have any of the following problems you may call Maternity Admissions Unit at [phone].   If you have pain that does not get better  with pain medication   Bleeding that  soaks through 2 thick full-sized sanitary pads in an hour   Cramps that last longer than 2 days   Foul smelling discharge   Fever above 100.4 degrees F Even if you do not have any of these symptoms, you should have a follow-up exam to make sure you are healing properly. This appointment will be made for you before you leave the hospital. Your next normal period will start again in 4-6 week after the loss. You can get pregnant soon after the loss, so use birth control right away. Finally: Make sure all your questions are answered before during and after any procedure. Follow up with medical care and family planning methods.

## 2013-07-16 NOTE — MAU Note (Signed)
Pt reports vaginal bleeding off/on x one month. At times the bleeding is heavy and i pass clots. States she feels dizzy and is having headaches. States she had a miscarriage on Feb 27 and had bleeding for 2 weeks, then stopped for 2 weeks and then this bleeding started.

## 2013-07-16 NOTE — MAU Note (Signed)
Pt states she went to the gym and came home and took a shower and started bleeding a lot. Pt states it also happened 1 wk ago. Pt states she has been bleeding for wks

## 2013-07-16 NOTE — MAU Provider Note (Signed)
Chief Complaint: Vaginal Bleeding    First Provider Initiated Contact with Patient 07/16/2013 at 0320.  SUBJECTIVE HPI: Christie Holder is a 43 y.o. [phone] at 2 months status-post SAB who presents with heavy vaginal bleeding, passing clots, cramping, dizziness and headaches. Rates pain 7/10 on pain scale. Diagnosed with miscarriage 05/21/2013. Bled for 2 weeks, then stopped. Was seen in the office for miscarriage followup and left lower quadrant pain. See Quants below. Bleeding started again 3 weeks ago. Patient states she alternates between light bleeding and episodes of passing clots and having gushes of dark red blood. During these episodes she feels dizzy and gets a headache. Symptoms resolved with rest. Denies syncope, fever, chills, malodorous vaginal discharge, GI complaints, urinary complaints or passage of tissue. Has had intercourse twice in the past month and use condoms properly both times. Has laparoscopic tubal ligation scheduled 07/23/2013 with Dr. Debroah Loop. Concerned that these bleeding problems will interfere.  Results for Christie Holder, MCSWAIN (MRN [id number]) as of 07/16/2013 03:45  Ref. Range 05/22/2013 00:08 06/04/2013 11:38 06/23/2013 13:44 07/16/2013 01:33  hCG, Beta Chain, Quant, S Latest Range: <5 mIU/mL 1083 (H) 248.7 94.2  pending     Past Medical History  Diagnosis Date  . Gestational diabetes 10/10/2011   OB History  Gravida Para Term Preterm AB SAB TAB Ectopic Multiple Living  8 7 7  1 1    7     # Outcome Date GA Lbr Len/2nd Weight Sex Delivery Anes PTL Lv  8 TRM 12/19/11 [redacted]w[redacted]d  4.76 kg (10 lb 7.9 oz) M LTCS Spinal  Y  7 TRM 02/07/10 [redacted]w[redacted]d  3.629 kg (8 lb) M SVD EPI N Y  6 SAB 2009 [redacted]w[redacted]d         5 TRM 10/13/04 [redacted]w[redacted]d  4.536 kg (10 lb) M SVD EPI N Y     Comments: Delivered at [facility], mild shoulder dystocia, 4596g  4 TRM 03/31/03 [redacted]w[redacted]d  4.536 kg (10 lb) F SVD None N Y     Comments: Born in [place]  3 TRM 12/31/97 [redacted]w[redacted]d  4.99 kg (11 lb) F SVD None N Y     Comments:  Born in [place]  2 TRM 05/21/94 [redacted]w[redacted]d  4.536 kg (10 lb) F SVD None N Y     Comments: Fainted when she delivered in [place]  1 TRM 02/28/93 [redacted]w[redacted]d  2.268 kg (5 lb) F SVD None N Y     Comments: Born in [place]     Past Surgical History  Procedure Laterality Date  . No past surgeries    . Cesarean section  12/19/2011    Procedure: CESAREAN SECTION;  Surgeon: Willodean Rosenthal, MD;  Location: [facility];  Service: Obstetrics;  Laterality: N/A;   History   Social History  . Marital Status: Single    Spouse Name: N/A    Number of Children: N/A  . Years of Education: N/A   Occupational History  . Not on file.   Social History Main Topics  . Smoking status: Never Smoker   . Smokeless tobacco: Never Used  . Alcohol Use: No  . Drug Use: No  . Sexual Activity: Yes   Other Topics Concern  . Not on file   Social History Narrative  . No narrative on file   No current facility-administered medications on file prior to encounter.   Current Outpatient Prescriptions on File Prior to Encounter  Medication Sig Dispense Refill  . Multiple Vitamin (MULTIVITAMIN WITH MINERALS) TABS tablet Take  1 tablet by mouth daily.      Marland Kitchen. omega-3 acid ethyl esters (LOVAZA) 1 G capsule Take 1 g by mouth 2 (two) times daily.       No Known Allergies  ROS: Pertinent items in HPI  OBJECTIVE Blood pressure 114/68, pulse 55, temperature 98.4 F (36.9 C), temperature source Oral, resp. rate 18, height 5' 2.5" (1.588 m), weight 100.245 kg (221 lb), last menstrual period 06/29/2013, SpO2 100.00%, not currently breastfeeding. GENERAL: Well-developed, well-nourished female in mild to distress.  HEENT: Normocephalic HEART: normal rate RESP: normal effort ABDOMEN: Soft, non-tender. No CVA tenderness. Positive bowel sounds. EXTREMITIES: Nontender, no edema NEURO: Alert and oriented SPECULUM EXAM: NEFG, small amount of dark red bloody-mucous noted, normal odor. cervix clean BIMANUAL: cervix closed; uterus normal  size. No adnexal tenderness. No masses. Mild cervical motion tenderness.  LAB RESULTS Results for orders placed during the hospital encounter of 07/16/13 (from the past 24 hour(s))  URINALYSIS, ROUTINE W REFLEX MICROSCOPIC     Status: Abnormal   Collection Time    07/16/13  1:12 AM      Result Value Ref Range   Color, Urine YELLOW  YELLOW   APPearance CLEAR  CLEAR   Specific Gravity, Urine 1.020  1.005 - 1.030   pH 6.5  5.0 - 8.0   Glucose, UA NEGATIVE  NEGATIVE mg/dL   Hgb urine dipstick LARGE (*) NEGATIVE   Bilirubin Urine NEGATIVE  NEGATIVE   Ketones, ur NEGATIVE  NEGATIVE mg/dL   Protein, ur NEGATIVE  NEGATIVE mg/dL   Urobilinogen, UA 0.2  0.0 - 1.0 mg/dL   Nitrite NEGATIVE  NEGATIVE   Leukocytes, UA NEGATIVE  NEGATIVE  URINE MICROSCOPIC-ADD ON     Status: None   Collection Time    07/16/13  1:12 AM      Result Value Ref Range   Squamous Epithelial / LPF RARE  RARE   WBC, UA 0-2  <3 WBC/hpf   RBC / HPF 21-50  <3 RBC/hpf   Bacteria, UA RARE  RARE  HCG, QUANTITATIVE, PREGNANCY     Status: Abnormal   Collection Time    07/16/13  1:33 AM      Result Value Ref Range   hCG, Beta Chain, Quant, S 32 (*) <5 mIU/mL  CBC     Status: Abnormal   Collection Time    07/16/13  1:33 AM      Result Value Ref Range   WBC 10.1  4.0 - 10.5 K/uL   RBC 3.77 (*) 3.87 - 5.11 MIL/uL   Hemoglobin 12.3  12.0 - 15.0 g/dL   HCT 43.335.9 (*) 29.536.0 - 18.846.0 %   MCV 95.2  78.0 - 100.0 fL   MCH 32.6  26.0 - 34.0 pg   MCHC 34.3  30.0 - 36.0 g/dL   RDW 41.612.9  60.611.5 - 30.115.5 %   Platelets 219  150 - 400 K/uL    IMAGING [place] Ob Transvaginal  07/16/2013   CLINICAL DATA:  Two months post spontaneous abortion with persistent clots and cramping. Quantitative beta HCG is 32. Query new versus old pregnancy.  EXAM: TRANSVAGINAL OB ULTRASOUND  TECHNIQUE: Transvaginal ultrasound was performed for complete evaluation of the gestation as well as the maternal uterus, adnexal regions, and pelvic cul-de-sac.  COMPARISON:  US  OB COMP LESS 14 WK dated 05/22/2013  FINDINGS: Intrauterine gestational sac: No intrauterine gestational sac visualized.  Yolk sac:  Not visualized  Embryo:  Not visualized  Cardiac Activity: Not visualized  Maternal uterus/adnexae: The uterus is mildly anteverted. No myometrial masses are demonstrated. Endometrial stripe thickness is prominent, measuring 1.5 cm, with heterogeneous mixed echotexture material within the endometrium. Color flow Doppler images demonstrate flow within the endometrial contents. Findings are suspicious for retained products of conception. Both ovaries are visualized and demonstrate normal follicular changes without evidence of abnormal adnexal mass. No free pelvic fluid collections.  IMPRESSION: Heterogeneous expansion of the endometrium with flow demonstrated, suspicious for retained products of conception.   Electronically Signed   By: Burman Nieves M.D.   On: 07/16/2013 03:57   MAU COURSE Findings most likely represent retained products of conception. Extremely low suspicion for new pregnancy. Explained that this cannot be completely clarified without additional followup hCG level. Offered Cytotec for incomplete miscarriage. Patient requests to have it placed in maternity admissions now.       Early Intrauterine Pregnancy Failure  _x__  Documented intrauterine pregnancy failure less than or equal to [redacted] weeks gestation  _x__  No serious current illness  _x__  Baseline Hgb greater than or equal to 10g/dl  _x__  Patient has easily accessible transportation to the hospital  _x__  Clear preference  _x__  Practitioner/physician deems patient reliable  _x__  Counseling by practitioner or physician  _x__  Patient education by RN  _x__  Consent verbalized. Interpreter present.   ___  Rho-Gam given by RN if indicated  _x__ Medication dispensed   ___   Cytotec 800 mcg  __   Intravaginally by patient at home         _x_   Intravaginally by RN in MAU        __    Rectally by patient at home        __   Rectally by RN in MAU  _x__  Ibuprofen 600 mg 1 tablet by mouth every 6 hours as needed #30  _x__  Hydrocodone/acetaminophen 5/325 mg by mouth every 4 to 6 hours as needed  _D__  Phenergan 12.5 mg by mouth every 4 hours as needed for nausea    ASSESSMENT 1. Incomplete miscarriage    PLAN Discharge home in stable condition per consult with Dr. Debroah Loop. Bleeding and infection precautions.  Urine culture pending.     Follow-up Information   Follow up with [facility] On 07/19/2013. (for lab work)    Specialty:  Obstetrics and Gynecology   Contact information:   [address] [place] [address] [phone]      Follow up with Scheryl Darter, MD On 07/21/2013. (To review lab work and Doctor, general practice for tubal ligation.)    Specialty:  Obstetrics and Gynecology   Contact information:   [address] [place] [address] [phone]       Follow up with [facility] [place] [facility]. (As needed in emergencies)    Contact information:   [address] [id number] [place] [address] [phone]       Medication List         ibuprofen 600 MG tablet  Commonly known as:  ADVIL,MOTRIN  Take 1 tablet (600 mg total) by mouth every 6 (six) hours as needed for cramping.     multivitamin with minerals Tabs tablet  Take 1 tablet by mouth daily.     omega-3 acid ethyl esters 1 G capsule  Commonly known as:  LOVAZA  Take 1 g by mouth 2 (two) times daily.     oxyCODONE-acetaminophen 5-325 MG per tablet  Commonly known as:  PERCOCET/ROXICET  Take 1-2 tablets by mouth every 4 (four) hours as needed for severe pain.       [place]ia Marketa Midkiff, CNM 07/16/2013  4:49 AM

## 2013-07-16 NOTE — Progress Notes (Signed)
Pt states she is feeling pain in her lower back and pelvic area

## 2013-07-19 ENCOUNTER — Other Ambulatory Visit: Payer: BC Managed Care – PPO

## 2013-07-21 ENCOUNTER — Telehealth: Payer: Self-pay | Admitting: *Deleted

## 2013-07-21 ENCOUNTER — Telehealth: Payer: Self-pay | Admitting: Obstetrics & Gynecology

## 2013-07-21 ENCOUNTER — Encounter: Payer: BC Managed Care – PPO | Admitting: Obstetrics & Gynecology

## 2013-07-21 NOTE — Telephone Encounter (Signed)
Christie Holder missed her appointment scheduled for follow up after incomplete miscarriage. Interpreter called patient to reschedule. Patient voices she was not happy with her care after miscarriage and spoke at length to interpreter.  Explained to patient it is important she be seen and evaluated to make sure miscarriage complete because there could be complications.  Christie Holder then states does have fever, chills, feels cold today. Instructed Christie HesselbachMaria to come to MAU today asap for evaluation. She agreed she would come to MAU for evaluation. Patient also at end of conversation expresses psychological trauma after having 2 miscarriages this year.  Also states she wants to cancel BTL scheduled for 07/23/13.     Called Ob/gyn office to cancel btl. Called MAU to tell of patient expected visit.

## 2013-07-21 NOTE — Telephone Encounter (Signed)
Called patient to see if she was aware of appointment but no answer. Left message to call back as soon as she could to reschedule. Also tried calling emergency contact but no answer.

## 2013-07-23 ENCOUNTER — Ambulatory Visit (HOSPITAL_COMMUNITY)
Admission: RE | Admit: 2013-07-23 | Payer: BC Managed Care – PPO | Source: Ambulatory Visit | Admitting: Obstetrics & Gynecology

## 2013-07-23 ENCOUNTER — Encounter (HOSPITAL_COMMUNITY): Admission: RE | Payer: Self-pay | Source: Ambulatory Visit

## 2013-07-23 SURGERY — LIGATION, FALLOPIAN TUBE, LAPAROSCOPIC
Anesthesia: Choice | Site: Abdomen | Laterality: Bilateral

## 2014-01-24 ENCOUNTER — Encounter (HOSPITAL_COMMUNITY): Payer: Self-pay | Admitting: *Deleted

## 2014-03-04 ENCOUNTER — Emergency Department (HOSPITAL_COMMUNITY)
Admission: EM | Admit: 2014-03-04 | Discharge: 2014-03-05 | Disposition: A | Discharge status: Home / Self Care | Payer: BC Managed Care – PPO | Attending: Emergency Medicine | Admitting: Emergency Medicine

## 2014-03-04 ENCOUNTER — Emergency Department (HOSPITAL_COMMUNITY): Payer: BC Managed Care – PPO

## 2014-03-04 ENCOUNTER — Encounter (HOSPITAL_COMMUNITY): Payer: Self-pay | Admitting: Emergency Medicine

## 2014-03-04 DIAGNOSIS — S199XXA Unspecified injury of neck, initial encounter: Secondary | ICD-10-CM | POA: Diagnosis not present

## 2014-03-04 DIAGNOSIS — Y9289 Other specified places as the place of occurrence of the external cause: Secondary | ICD-10-CM | POA: Diagnosis not present

## 2014-03-04 DIAGNOSIS — S060X0A Concussion without loss of consciousness, initial encounter: Secondary | ICD-10-CM

## 2014-03-04 DIAGNOSIS — Z8632 Personal history of gestational diabetes: Secondary | ICD-10-CM | POA: Diagnosis not present

## 2014-03-04 DIAGNOSIS — R519 Headache, unspecified: Secondary | ICD-10-CM

## 2014-03-04 DIAGNOSIS — R51 Headache: Secondary | ICD-10-CM

## 2014-03-04 DIAGNOSIS — Y9389 Activity, other specified: Secondary | ICD-10-CM | POA: Insufficient documentation

## 2014-03-04 DIAGNOSIS — Y998 Other external cause status: Secondary | ICD-10-CM | POA: Insufficient documentation

## 2014-03-04 DIAGNOSIS — S0990XA Unspecified injury of head, initial encounter: Secondary | ICD-10-CM | POA: Diagnosis present

## 2014-03-04 DIAGNOSIS — Z79899 Other long term (current) drug therapy: Secondary | ICD-10-CM | POA: Diagnosis not present

## 2014-03-04 DIAGNOSIS — T1490XA Injury, unspecified, initial encounter: Secondary | ICD-10-CM

## 2014-03-04 MED ORDER — HYDROCODONE-ACETAMINOPHEN 5-325 MG PO TABS
1.0000 | ORAL_TABLET | Freq: Once | ORAL | Status: AC
  Administered 2014-03-04: 1 via ORAL
  Filled 2014-03-04: qty 1
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
## 2014-03-04 NOTE — ED Notes (Signed)
Pt placed in a gown with continuous pulse ox and BP monitoring

## 2014-03-05 MED ORDER — HYDROCODONE-ACETAMINOPHEN 5-325 MG PO TABS: 1.0000 | ORAL_TABLET | Freq: Once | ORAL | Status: DC

## 2014-03-05 MED ORDER — ONDANSETRON 4 MG PO TBDP
4.0000 mg | ORAL_TABLET | Freq: Three times a day (TID) | ORAL | Status: DC | PRN
Start: 2014-03-05 — End: 2016-10-02

## 2014-03-05 NOTE — Discharge Instructions (Signed)
Your xray s are normal please follow up with your PCP in the next 1-2 weeks

## 2014-11-16 ENCOUNTER — Ambulatory Visit: Payer: Self-pay | Admitting: Obstetrics & Gynecology

## 2015-03-02 ENCOUNTER — Ambulatory Visit (HOSPITAL_COMMUNITY): Admit: 2015-03-02 | Payer: Self-pay | Admitting: Obstetrics and Gynecology

## 2015-03-02 ENCOUNTER — Encounter (HOSPITAL_COMMUNITY): Payer: Self-pay

## 2015-03-02 SURGERY — SALPINGECTOMY, BILATERAL, LAPAROSCOPIC
Anesthesia: General

## 2015-09-19 ENCOUNTER — Encounter (HOSPITAL_COMMUNITY): Payer: Self-pay | Admitting: Emergency Medicine

## 2015-09-19 ENCOUNTER — Ambulatory Visit (HOSPITAL_COMMUNITY)
Admission: EM | Admit: 2015-09-19 | Discharge: 2015-09-19 | Disposition: A | Discharge status: Home / Self Care | Payer: Self-pay | Attending: Family Medicine | Admitting: Family Medicine

## 2015-09-19 DIAGNOSIS — L03115 Cellulitis of right lower limb: Secondary | ICD-10-CM

## 2015-09-19 MED ORDER — CEPHALEXIN 500 MG PO CAPS: 500.0000 mg | ORAL_CAPSULE | Freq: Four times a day (QID) | ORAL | Status: DC

## 2015-09-19 NOTE — ED Notes (Signed)
Pt has three marks on her right heel that appear to be scabs.  She states they started out as blisters that she popped three weeks ago and a lot of water came out of them.  She has been using different OTC creams on them since then but they will not go away.  She states they burn, itch and are very painful when she walks.  She uses a shower at her gym and is concerned it came from there.

## 2015-09-19 NOTE — ED Provider Notes (Signed)
CSN: [id number]     Arrival date & time 09/19/15  1907 History   First MD Initiated Contact with Patient 09/19/15 1936     Chief Complaint  Patient presents with  . Foot Problem    right   (Consider location/radiation/quality/duration/timing/severity/associated sxs/prior Treatment) Patient is a 45 y.o. female presenting with lower extremity pain. The history is provided by the patient.  Foot Pain This is a new problem. The current episode started more than 1 week ago (3 weeks of problem.blisters on os calcis of right foot felt to be related to blisters.). The problem has been gradually worsening. The symptoms are aggravated by walking.    Past Medical History  Diagnosis Date  . Gestational diabetes 10/10/2011   Past Surgical History  Procedure Laterality Date  . No past surgeries    . Cesarean section  12/19/2011    Procedure: CESAREAN SECTION;  Surgeon: Willodean Rosenthalarolyn Harraway-Smith, MD;  Location: [facility];  Service: Obstetrics;  Laterality: N/A;   Family History  Problem Relation Age of Onset  . Diabetes Maternal Grandmother    Social History  Substance Use Topics  . Smoking status: Never Smoker   . Smokeless tobacco: Never Used  . Alcohol Use: No   OB History    Gravida Para Term Preterm AB TAB SAB Ectopic Multiple Living   8 7 7  1  1   7      Review of Systems  Skin: Positive for wound.  All other systems reviewed and are negative.   Allergies  Review of patient's allergies indicates no known allergies.  Home Medications   Prior to Admission medications   Medication Sig Start Date End Date Taking? Authorizing Provider  acetaminophen (TYLENOL) 500 MG tablet Take 1,000 mg by mouth every 6 (six) hours as needed for mild pain.    Historical Provider, MD  cephALEXin (KEFLEX) 500 MG capsule Take 1 capsule (500 mg total) by mouth 4 (four) times daily. Take all of medicine and drink lots of fluids 09/19/15   Linna HoffJames D Kindl, MD  HYDROcodone-acetaminophen (NORCO/VICODIN) 5-325 MG  per tablet Take 1 tablet by mouth once. 03/05/14   Earley FavorGail Schulz, NP  ibuprofen (ADVIL,MOTRIN) 600 MG tablet Take 1 tablet (600 mg total) by mouth every 6 (six) hours as needed for cramping. 07/16/13   Dorathy KinsmanVirginia Smith, CNM  Multiple Vitamin (MULTIVITAMIN WITH MINERALS) TABS tablet Take 1 tablet by mouth daily.    Historical Provider, MD  norethindrone (MICRONOR,CAMILA,ERRIN) 0.35 MG tablet Take 1 tablet by mouth daily.    Historical Provider, MD  omega-3 acid ethyl esters (LOVAZA) 1 G capsule Take 1 g by mouth 2 (two) times daily.    Historical Provider, MD  ondansetron (ZOFRAN ODT) 4 MG disintegrating tablet Take 1 tablet (4 mg total) by mouth every 8 (eight) hours as needed for nausea or vomiting. 03/05/14   Earley FavorGail Schulz, NP  oxyCODONE-acetaminophen (PERCOCET/ROXICET) 5-325 MG per tablet Take 1-2 tablets by mouth every 4 (four) hours as needed for severe pain. 07/16/13   Dorathy KinsmanVirginia Smith, CNM   Meds Ordered and Administered this Visit  Medications - No data to display  BP 106/69 mmHg  Pulse 90  Temp(Src) 98.3 F (36.8 C) (Oral)  Resp 18  SpO2 100%  LMP 08/31/2015 (Exact Date) No data found.   Physical Exam  Constitutional: She is oriented to person, place, and time. She appears well-developed and well-nourished.  Musculoskeletal: She exhibits tenderness.  Tender fluctuant sts to heel of right foot, min erythema.  Neurological:  She is alert and oriented to person, place, and time.  Skin: Skin is warm and dry.  Nursing note and vitals reviewed.   ED Course  Procedures (including critical care time)  Labs Review Labs Reviewed - No data to display  Imaging Review No results found.   Visual Acuity Review  Right Eye Distance:   Left Eye Distance:   Bilateral Distance:    Right Eye Near:   Left Eye Near:    Bilateral Near:         MDM   1. Cellulitis of foot, right        Linna HoffJames D Kindl, MD 09/19/15 [phone]

## 2015-09-19 NOTE — ED Notes (Signed)
Pt did not want to put the Post op shoe on here. She is taking it home and will use it there.

## 2015-09-19 NOTE — Discharge Instructions (Signed)
See orthopedist if further problems °

## 2016-04-22 ENCOUNTER — Encounter: Payer: Self-pay | Admitting: Podiatry

## 2016-05-12 ENCOUNTER — Emergency Department (HOSPITAL_COMMUNITY)
Admission: EM | Admit: 2016-05-12 | Discharge: 2016-05-12 | Disposition: A | Discharge status: Home / Self Care | Payer: BLUE CROSS/BLUE SHIELD | Attending: Emergency Medicine | Admitting: Emergency Medicine

## 2016-05-12 ENCOUNTER — Encounter (HOSPITAL_COMMUNITY): Payer: Self-pay

## 2016-05-12 DIAGNOSIS — J111 Influenza due to unidentified influenza virus with other respiratory manifestations: Secondary | ICD-10-CM | POA: Insufficient documentation

## 2016-05-12 DIAGNOSIS — Z79899 Other long term (current) drug therapy: Secondary | ICD-10-CM | POA: Insufficient documentation

## 2016-05-12 DIAGNOSIS — R69 Illness, unspecified: Secondary | ICD-10-CM

## 2016-05-12 DIAGNOSIS — R197 Diarrhea, unspecified: Secondary | ICD-10-CM

## 2016-05-12 DIAGNOSIS — R509 Fever, unspecified: Secondary | ICD-10-CM | POA: Diagnosis present

## 2016-05-12 DIAGNOSIS — R112 Nausea with vomiting, unspecified: Secondary | ICD-10-CM

## 2016-05-12 LAB — I-STAT CHEM 8, ED
BUN: <3 mg/dL — ABNORMAL LOW (ref 6–20)
CHLORIDE: 102 mmol/L (ref 101–111)
Calcium, Ion: 1.08 mmol/L — ABNORMAL LOW (ref 1.15–1.40)
Creatinine, Ser: 0.6 mg/dL (ref 0.44–1.00)
GLUCOSE: 115 mg/dL — AB (ref 65–99)
HCT: 36 % (ref 36.0–46.0)
Hemoglobin: 12.2 g/dL (ref 12.0–15.0)
POTASSIUM: 3.5 mmol/L (ref 3.5–5.1)
Sodium: 137 mmol/L (ref 135–145)
TCO2: 23 mmol/L (ref 0–100)

## 2016-05-12 LAB — URINALYSIS, ROUTINE W REFLEX MICROSCOPIC
Bilirubin Urine: NEGATIVE
GLUCOSE, UA: NEGATIVE mg/dL
Hgb urine dipstick: NEGATIVE
Ketones, ur: NEGATIVE mg/dL
LEUKOCYTES UA: NEGATIVE
Nitrite: NEGATIVE
PH: 6 (ref 5.0–8.0)
Protein, ur: NEGATIVE mg/dL
Specific Gravity, Urine: 1.004 — ABNORMAL LOW (ref 1.005–1.030)

## 2016-05-12 LAB — PREGNANCY, URINE: PREG TEST UR: NEGATIVE

## 2016-05-12 MED ORDER — PROMETHAZINE HCL 25 MG PO TABS: 25.0000 mg | ORAL_TABLET | Freq: Four times a day (QID) | ORAL | 0 refills | Status: DC | PRN

## 2016-05-12 MED ORDER — ONDANSETRON 4 MG PO TBDP
4.0000 mg | ORAL_TABLET | Freq: Once | ORAL | Status: AC
  Administered 2016-05-12: 4 mg via ORAL
  Filled 2016-05-12: qty 1

## 2016-05-12 MED ORDER — BENZONATATE 100 MG PO CAPS: 100.0000 mg | ORAL_CAPSULE | Freq: Three times a day (TID) | ORAL | 0 refills | Status: DC

## 2016-05-12 MED ORDER — PROMETHAZINE HCL 25 MG PO TABS
25.0000 mg | ORAL_TABLET | Freq: Once | ORAL | Status: AC
  Administered 2016-05-12: 25 mg via ORAL
  Filled 2016-05-12: qty 1

## 2016-05-12 MED ORDER — NAPROXEN 500 MG PO TABS: 500.0000 mg | ORAL_TABLET | Freq: Two times a day (BID) | ORAL | 0 refills | Status: DC

## 2016-05-12 NOTE — ED Provider Notes (Signed)
[facility]-EMERGENCY DEPT Provider Note    By signing my name below, I, Christie Holder, attest that this documentation has been prepared under the direction and in the presence of [facility], [place]. Electronically Signed: Earmon Holder, ED Scribe. 05/12/16. 7:40 PM.   History   Chief Complaint Chief Complaint  Patient presents with  . Generalized Body Aches    The history is provided by the patient and medical records. No language interpreter was used.    Christie Holder is an obese 46 y.o. female who presents to the Emergency Department complaining of flu like symptoms that began three days ago. She reports associated dry cough, congestion, rhinorrhea, chills, fever, generalized body aches, HA, post nasal drip, sinus pressure, right ear pain, sore throat, vomiting (2 times per day), nausea and diarrhea (3 times daily). She has taken Motrin and Mucinex with minimal relief. She reports having sick contacts at the gym she goes to. Eating and drinking cause her nausea and vomiting. She denies alleviating factors. She denies dysuria, frequency, hematuria, difficulty swallowing or breathing. She denies allergies to any medications. She denies any allergies to any medications. She is not on birth control but reports using condoms each sexual encounter. Her LMP was 05/08/16.   Past Medical History:  Diagnosis Date  . Gestational diabetes 10/10/2011    Patient Active Problem List   Diagnosis Date Noted  . Complete miscarriage 06/23/2013  . History of macrosomia in infant in prior pregnancy, currently pregnant 12/02/2011  . Obesity 07/04/2011    Past Surgical History:  Procedure Laterality Date  . CESAREAN SECTION  12/19/2011   Procedure: CESAREAN SECTION;  Surgeon: Willodean Rosenthal, MD;  Location: [facility];  Service: Obstetrics;  Laterality: N/A;  . NO PAST SURGERIES      OB History    Gravida Para Term Preterm AB Living   8 7 7   1 7    SAB TAB Ectopic Multiple Live Births   1        7       Home Medications    Prior to Admission medications   Medication Sig Start Date End Date Taking? Authorizing Provider  acetaminophen (TYLENOL) 500 MG tablet Take 1,000 mg by mouth every 6 (six) hours as needed for mild pain.    Historical Provider, MD  benzonatate (TESSALON) 100 MG capsule Take 1 capsule (100 mg total) by mouth every 8 (eight) hours. 05/12/16   Hope Orlene Och, NP  cephALEXin (KEFLEX) 500 MG capsule Take 1 capsule (500 mg total) by mouth 4 (four) times daily. Take all of medicine and drink lots of fluids 09/19/15   Linna Hoff, MD  HYDROcodone-acetaminophen (NORCO/VICODIN) 5-325 MG per tablet Take 1 tablet by mouth once. 03/05/14   Earley Favor, NP  ibuprofen (ADVIL,MOTRIN) 600 MG tablet Take 1 tablet (600 mg total) by mouth every 6 (six) hours as needed for cramping. 07/16/13   Dorathy Kinsman, CNM  Multiple Vitamin (MULTIVITAMIN WITH MINERALS) TABS tablet Take 1 tablet by mouth daily.    Historical Provider, MD  naproxen (NAPROSYN) 500 MG tablet Take 1 tablet (500 mg total) by mouth 2 (two) times daily. 05/12/16   Hope Orlene Och, NP  norethindrone (MICRONOR,CAMILA,ERRIN) 0.35 MG tablet Take 1 tablet by mouth daily.    Historical Provider, MD  omega-3 acid ethyl esters (LOVAZA) 1 G capsule Take 1 g by mouth 2 (two) times daily.    Historical Provider, MD  ondansetron (ZOFRAN ODT) 4 MG disintegrating tablet Take 1 tablet (4  mg total) by mouth every 8 (eight) hours as needed for nausea or vomiting. 03/05/14   Earley FavorGail Schulz, NP  oxyCODONE-acetaminophen (PERCOCET/ROXICET) 5-325 MG per tablet Take 1-2 tablets by mouth every 4 (four) hours as needed for severe pain. 07/16/13   Dorathy KinsmanVirginia Smith, CNM  promethazine (PHENERGAN) 25 MG tablet Take 1 tablet (25 mg total) by mouth every 6 (six) hours as needed for nausea or vomiting. 05/12/16   Hope Orlene OchM Neese, NP    Family History Family History  Problem Relation Age of Onset  . Diabetes Maternal Grandmother     Social History Social History    Substance Use Topics  . Smoking status: Never Smoker  . Smokeless tobacco: Never Used  . Alcohol use No     Allergies   Patient has no known allergies.   Review of Systems Review of Systems  Constitutional: Positive for chills and fever. Negative for diaphoresis and fatigue.  HENT: Positive for congestion, ear pain, postnasal drip, rhinorrhea, sinus pressure and sore throat. Negative for dental problem, facial swelling and trouble swallowing.   Eyes: Negative for photophobia, pain and discharge.  Respiratory: Positive for cough. Negative for chest tightness, shortness of breath and wheezing.   Gastrointestinal: Positive for diarrhea, nausea and vomiting. Negative for abdominal distention, abdominal pain and constipation.  Genitourinary: Negative for difficulty urinating, dysuria, flank pain, frequency and hematuria.  Musculoskeletal: Positive for myalgias. Negative for back pain, gait problem, neck pain and neck stiffness.  Skin: Negative for color change and rash.  Neurological: Positive for headaches. Negative for dizziness, speech difficulty, weakness, light-headedness and numbness.  Psychiatric/Behavioral: Negative for agitation and confusion.     Physical Exam Updated Vital Signs BP 110/72 (BP Location: Right Arm)   Pulse 84   Temp 99 F (37.2 C) (Oral)   Resp 18   SpO2 98%   Physical Exam  Constitutional: She is oriented to person, place, and time. She appears well-developed and well-nourished.  HENT:  Head: Atraumatic.  Mouth/Throat: Uvula is midline, oropharynx is clear and moist and mucous membranes are normal. No oropharyngeal exudate, posterior oropharyngeal edema, posterior oropharyngeal erythema or tonsillar abscesses.  TMs are dull bilaterally.  Eyes: EOM are normal. Pupils are equal, round, and reactive to light.  Sclera clear bilaterally.  Neck: Neck supple.  Cardiovascular: Normal rate, regular rhythm and normal heart sounds.  Exam reveals no gallop and  no friction rub.   No murmur heard. Pulmonary/Chest: Effort normal and breath sounds normal. No respiratory distress. She has no wheezes. She has no rales.  Abdominal: Soft. Bowel sounds are normal. There is no tenderness.  Musculoskeletal: Normal range of motion. She exhibits no edema.  No lower extremity edema.  Lymphadenopathy:    She has no cervical adenopathy.  Neurological: She is alert and oriented to person, place, and time. No cranial nerve deficit.  Skin: Skin is warm and dry.  Nursing note and vitals reviewed.    ED Treatments / Results  DIAGNOSTIC STUDIES: Oxygen Saturation is 98% on RA, normal by my interpretation.   COORDINATION OF CARE: 4:52 PM- Will prescribe cough medication and medication for nausea. Encouraged pt to continue taking OTC Motrin for fever and body aches. Will check urinalysis prior to discharge. Pt verbalizes understanding and agrees to plan.  Medications  promethazine (PHENERGAN) tablet 25 mg (not administered)  ondansetron (ZOFRAN-ODT) disintegrating tablet 4 mg (4 mg Oral Given 05/12/16 1714)    Labs (all labs ordered are listed, but only abnormal results are displayed) Labs Reviewed  URINALYSIS, ROUTINE W REFLEX MICROSCOPIC - Abnormal; Notable for the following:       Result Value   Color, Urine STRAW (*)    Specific Gravity, Urine 1.004 (*)    All other components within normal limits  I-STAT CHEM 8, ED - Abnormal; Notable for the following:    BUN <3 (*)    Glucose, Bld 115 (*)    Calcium, Ion 1.08 (*)    All other components within normal limits  PREGNANCY, URINE    EKG  EKG Interpretation None       Radiology No results found.  Procedures Procedures (including critical care time)  Medications Ordered in ED Medications  promethazine (PHENERGAN) tablet 25 mg (not administered)  ondansetron (ZOFRAN-ODT) disintegrating tablet 4 mg (4 mg Oral Given 05/12/16 1714)     Initial Impression / Assessment and Plan / ED Course    I have reviewed the triage vital signs and the nursing notes.  Pertinent labs & imaging results that were available during my care of the patient were reviewed by me and considered in my medical decision making (see chart for details).     Patient with symptoms consistent with influenza.  Vitals are stable, low-grade fever. No signs of dehydration, tolerating PO's. Lungs are clear. Due to patient's presentation and physical exam a chest x-Marlene Beidler was not ordered bc likely diagnosis of flu. Discussed the cost versus benefit of Tamiflu treatment with the patient.  The patient understands that symptoms are greater than the recommended 24-48 hour window of treatment. Patient will be discharged with instructions to orally hydrate, rest, and use over-the-counter medications such as anti-inflammatories ibuprofen and Aleve for muscle aches and Tylenol for fever. Patient will also be given a cough suppressant.    I personally performed the services described in this documentation, which was scribed in my presence. The recorded information has been reviewed and considered.    Final Clinical Impressions(s) / ED Diagnoses   Final diagnoses:  Influenza-like illness  Nausea vomiting and diarrhea    New Prescriptions New Prescriptions   BENZONATATE (TESSALON) 100 MG CAPSULE    Take 1 capsule (100 mg total) by mouth every 8 (eight) hours.   NAPROXEN (NAPROSYN) 500 MG TABLET    Take 1 tablet (500 mg total) by mouth 2 (two) times daily.   PROMETHAZINE (PHENERGAN) 25 MG TABLET    Take 1 tablet (25 mg total) by mouth every 6 (six) hours as needed for nausea or vomiting.     Margarita Grizzle, MD 05/12/16 [phone]

## 2016-05-12 NOTE — ED Notes (Signed)
Pa notified pt is requesting pain medication'

## 2016-05-12 NOTE — ED Triage Notes (Signed)
Patient complains of body aches, fever, chills and cough x 3 days, NAD. Had motrin 3 hours pta

## 2016-05-12 NOTE — Discharge Instructions (Signed)
Take the medications as directed. Follow up with your doctor or return here for worsening symptoms  Use Chloraseptic spray for sore throat.

## 2016-06-25 NOTE — Progress Notes (Signed)
This encounter was created in error - please disregard.

## 2016-10-01 ENCOUNTER — Observation Stay (HOSPITAL_COMMUNITY)
Admission: EM | Admit: 2016-10-01 | Discharge: 2016-10-02 | Disposition: A | Discharge status: Home / Self Care | Payer: BLUE CROSS/BLUE SHIELD | Attending: General Surgery | Admitting: General Surgery

## 2016-10-01 ENCOUNTER — Emergency Department (HOSPITAL_COMMUNITY): Payer: BLUE CROSS/BLUE SHIELD

## 2016-10-01 ENCOUNTER — Inpatient Hospital Stay (HOSPITAL_COMMUNITY): Payer: BLUE CROSS/BLUE SHIELD | Admitting: Certified Registered Nurse Anesthetist

## 2016-10-01 ENCOUNTER — Encounter (HOSPITAL_COMMUNITY): Payer: Self-pay | Admitting: Emergency Medicine

## 2016-10-01 ENCOUNTER — Encounter (HOSPITAL_COMMUNITY)
Admission: EM | Disposition: A | Discharge status: Home / Self Care | Payer: Self-pay | Source: Home / Self Care | Attending: Emergency Medicine

## 2016-10-01 DIAGNOSIS — E669 Obesity, unspecified: Secondary | ICD-10-CM | POA: Diagnosis not present

## 2016-10-01 DIAGNOSIS — K8020 Calculus of gallbladder without cholecystitis without obstruction: Secondary | ICD-10-CM | POA: Diagnosis present

## 2016-10-01 DIAGNOSIS — Z6841 Body Mass Index (BMI) 40.0 and over, adult: Secondary | ICD-10-CM | POA: Insufficient documentation

## 2016-10-01 DIAGNOSIS — K8010 Calculus of gallbladder with chronic cholecystitis without obstruction: Secondary | ICD-10-CM | POA: Diagnosis not present

## 2016-10-01 HISTORY — PX: CHOLECYSTECTOMY: SHX55

## 2016-10-01 LAB — URINALYSIS, ROUTINE W REFLEX MICROSCOPIC
BILIRUBIN URINE: NEGATIVE
Glucose, UA: NEGATIVE mg/dL
KETONES UR: NEGATIVE mg/dL
LEUKOCYTES UA: NEGATIVE
Nitrite: NEGATIVE
PROTEIN: NEGATIVE mg/dL
Specific Gravity, Urine: 1.021 (ref 1.005–1.030)
pH: 6 (ref 5.0–8.0)

## 2016-10-01 LAB — HEPATIC FUNCTION PANEL
ALBUMIN: 4 g/dL (ref 3.5–5.0)
ALT: 28 U/L (ref 14–54)
AST: 26 U/L (ref 15–41)
Alkaline Phosphatase: 74 U/L (ref 38–126)
Bilirubin, Direct: 0.1 mg/dL (ref 0.1–0.5)
Indirect Bilirubin: 0.5 mg/dL (ref 0.3–0.9)
TOTAL PROTEIN: 7.3 g/dL (ref 6.5–8.1)
Total Bilirubin: 0.6 mg/dL (ref 0.3–1.2)

## 2016-10-01 LAB — BASIC METABOLIC PANEL
ANION GAP: 8 (ref 5–15)
BUN: 15 mg/dL (ref 6–20)
CALCIUM: 9 mg/dL (ref 8.9–10.3)
CO2: 22 mmol/L (ref 22–32)
CREATININE: 0.72 mg/dL (ref 0.44–1.00)
Chloride: 104 mmol/L (ref 101–111)
Glucose, Bld: 119 mg/dL — ABNORMAL HIGH (ref 65–99)
Potassium: 3.7 mmol/L (ref 3.5–5.1)
Sodium: 134 mmol/L — ABNORMAL LOW (ref 135–145)

## 2016-10-01 LAB — CBC
HCT: 37.7 % (ref 36.0–46.0)
HEMOGLOBIN: 13.2 g/dL (ref 12.0–15.0)
MCH: 32.8 pg (ref 26.0–34.0)
MCHC: 35 g/dL (ref 30.0–36.0)
MCV: 93.5 fL (ref 78.0–100.0)
PLATELETS: 272 10*3/uL (ref 150–400)
RBC: 4.03 MIL/uL (ref 3.87–5.11)
RDW: 12.6 % (ref 11.5–15.5)
WBC: 11.8 10*3/uL — ABNORMAL HIGH (ref 4.0–10.5)

## 2016-10-01 LAB — SURGICAL PCR SCREEN
MRSA, PCR: NEGATIVE
STAPHYLOCOCCUS AUREUS: NEGATIVE

## 2016-10-01 LAB — POC URINE PREG, ED: Preg Test, Ur: NEGATIVE

## 2016-10-01 SURGERY — LAPAROSCOPIC CHOLECYSTECTOMY
Anesthesia: General

## 2016-10-01 MED ORDER — ONDANSETRON 4 MG PO TBDP
8.0000 mg | ORAL_TABLET | Freq: Once | ORAL | Status: AC
  Administered 2016-10-01: 8 mg via ORAL

## 2016-10-01 MED ORDER — DEXTROSE 5 % IV SOLN
2.0000 g | INTRAVENOUS | Status: DC
  Administered 2016-10-01: 2 g via INTRAVENOUS
  Filled 2016-10-01: qty 2

## 2016-10-01 MED ORDER — ONDANSETRON 4 MG PO TBDP
ORAL_TABLET | ORAL | Status: AC
  Filled 2016-10-01: qty 2

## 2016-10-01 MED ORDER — DEXAMETHASONE SODIUM PHOSPHATE 10 MG/ML IJ SOLN
INTRAMUSCULAR | Status: DC | PRN
  Administered 2016-10-01: 10 mg via INTRAVENOUS

## 2016-10-01 MED ORDER — DEXAMETHASONE SODIUM PHOSPHATE 10 MG/ML IJ SOLN
INTRAMUSCULAR | Status: AC
  Filled 2016-10-01: qty 1

## 2016-10-01 MED ORDER — ONDANSETRON HCL 4 MG/2ML IJ SOLN: 4.0000 mg | Freq: Four times a day (QID) | INTRAMUSCULAR | Status: DC | PRN

## 2016-10-01 MED ORDER — PROPOFOL 10 MG/ML IV BOLUS
INTRAVENOUS | Status: DC | PRN
  Administered 2016-10-01: 150 mg via INTRAVENOUS

## 2016-10-01 MED ORDER — ROCURONIUM BROMIDE 10 MG/ML (PF) SYRINGE
PREFILLED_SYRINGE | INTRAVENOUS | Status: DC | PRN
  Administered 2016-10-01: 40 mg via INTRAVENOUS

## 2016-10-01 MED ORDER — PROPOFOL 10 MG/ML IV BOLUS
INTRAVENOUS | Status: AC
  Filled 2016-10-01: qty 20

## 2016-10-01 MED ORDER — ONDANSETRON 4 MG PO TBDP: 4.0000 mg | ORAL_TABLET | Freq: Four times a day (QID) | ORAL | Status: DC | PRN

## 2016-10-01 MED ORDER — ACETAMINOPHEN 325 MG PO TABS: 650.0000 mg | ORAL_TABLET | Freq: Four times a day (QID) | ORAL | Status: DC | PRN

## 2016-10-01 MED ORDER — MIDAZOLAM HCL 2 MG/2ML IJ SOLN
INTRAMUSCULAR | Status: AC
  Filled 2016-10-01: qty 2

## 2016-10-01 MED ORDER — SUCCINYLCHOLINE CHLORIDE 200 MG/10ML IV SOSY
PREFILLED_SYRINGE | INTRAVENOUS | Status: AC
  Filled 2016-10-01: qty 10

## 2016-10-01 MED ORDER — IOPAMIDOL (ISOVUE-300) INJECTION 61%
INTRAVENOUS | Status: AC
  Filled 2016-10-01: qty 50

## 2016-10-01 MED ORDER — HYDROMORPHONE HCL 1 MG/ML IJ SOLN
1.0000 mg | Freq: Once | INTRAMUSCULAR | Status: AC
  Administered 2016-10-01: 1 mg via INTRAVENOUS
  Filled 2016-10-01: qty 1

## 2016-10-01 MED ORDER — BUPIVACAINE-EPINEPHRINE 0.25% -1:200000 IJ SOLN
INTRAMUSCULAR | Status: DC | PRN
  Administered 2016-10-01: 16 mL

## 2016-10-01 MED ORDER — ROCURONIUM BROMIDE 50 MG/5ML IV SOLN
INTRAVENOUS | Status: AC
  Filled 2016-10-01: qty 1

## 2016-10-01 MED ORDER — NEOSTIGMINE METHYLSULFATE 5 MG/5ML IV SOSY
PREFILLED_SYRINGE | INTRAVENOUS | Status: AC
  Filled 2016-10-01: qty 5

## 2016-10-01 MED ORDER — NEOSTIGMINE METHYLSULFATE 10 MG/10ML IV SOLN
INTRAVENOUS | Status: DC | PRN
  Administered 2016-10-01: 4 mg via INTRAVENOUS

## 2016-10-01 MED ORDER — SUCCINYLCHOLINE CHLORIDE 200 MG/10ML IV SOSY
PREFILLED_SYRINGE | INTRAVENOUS | Status: DC | PRN
  Administered 2016-10-01: 100 mg via INTRAVENOUS

## 2016-10-01 MED ORDER — METOPROLOL TARTRATE 5 MG/5ML IV SOLN: 5.0000 mg | Freq: Four times a day (QID) | INTRAVENOUS | Status: DC | PRN

## 2016-10-01 MED ORDER — FENTANYL CITRATE (PF) 100 MCG/2ML IJ SOLN: 25.0000 ug | INTRAMUSCULAR | Status: DC | PRN

## 2016-10-01 MED ORDER — ONDANSETRON HCL 4 MG/2ML IJ SOLN
INTRAMUSCULAR | Status: AC
  Filled 2016-10-01: qty 4

## 2016-10-01 MED ORDER — ONDANSETRON HCL 4 MG/2ML IJ SOLN
INTRAMUSCULAR | Status: DC | PRN
  Administered 2016-10-01: 4 mg via INTRAVENOUS

## 2016-10-01 MED ORDER — LIDOCAINE 2% (20 MG/ML) 5 ML SYRINGE
INTRAMUSCULAR | Status: DC | PRN
  Administered 2016-10-01: 60 mg via INTRAVENOUS

## 2016-10-01 MED ORDER — FENTANYL CITRATE (PF) 250 MCG/5ML IJ SOLN
INTRAMUSCULAR | Status: AC
  Filled 2016-10-01: qty 5

## 2016-10-01 MED ORDER — OXYCODONE-ACETAMINOPHEN 5-325 MG PO TABS
ORAL_TABLET | ORAL | Status: AC
  Filled 2016-10-01: qty 1

## 2016-10-01 MED ORDER — LACTATED RINGERS IV SOLN
INTRAVENOUS | Status: DC | PRN
  Administered 2016-10-01 (×2): via INTRAVENOUS

## 2016-10-01 MED ORDER — OXYCODONE-ACETAMINOPHEN 5-325 MG PO TABS
1.0000 | ORAL_TABLET | ORAL | Status: AC | PRN
  Administered 2016-10-01 (×2): 1 via ORAL
  Filled 2016-10-01: qty 1

## 2016-10-01 MED ORDER — ACETAMINOPHEN 650 MG RE SUPP: 650.0000 mg | Freq: Four times a day (QID) | RECTAL | Status: DC | PRN

## 2016-10-01 MED ORDER — ONDANSETRON HCL 4 MG/2ML IJ SOLN: 4.0000 mg | Freq: Once | INTRAMUSCULAR | Status: DC | PRN

## 2016-10-01 MED ORDER — FENTANYL CITRATE (PF) 100 MCG/2ML IJ SOLN
INTRAMUSCULAR | Status: DC | PRN
  Administered 2016-10-01 (×3): 50 ug via INTRAVENOUS
  Administered 2016-10-01 (×2): 25 ug via INTRAVENOUS

## 2016-10-01 MED ORDER — BUPIVACAINE-EPINEPHRINE (PF) 0.25% -1:200000 IJ SOLN
INTRAMUSCULAR | Status: AC
  Filled 2016-10-01: qty 30

## 2016-10-01 MED ORDER — LACTATED RINGERS IV SOLN
INTRAVENOUS | Status: DC
  Administered 2016-10-01: 14:00:00 via INTRAVENOUS

## 2016-10-01 MED ORDER — HYDROMORPHONE HCL 1 MG/ML IJ SOLN
0.5000 mg | Freq: Once | INTRAMUSCULAR | Status: AC
  Administered 2016-10-01: 0.5 mg via INTRAVENOUS
  Filled 2016-10-01: qty 0.5

## 2016-10-01 MED ORDER — PANTOPRAZOLE SODIUM 40 MG IV SOLR: 40.0000 mg | Freq: Every day | INTRAVENOUS | Status: DC

## 2016-10-01 MED ORDER — KCL IN DEXTROSE-NACL 20-5-0.45 MEQ/L-%-% IV SOLN: INTRAVENOUS | Status: DC

## 2016-10-01 MED ORDER — MEPERIDINE HCL 25 MG/ML IJ SOLN: 6.2500 mg | INTRAMUSCULAR | Status: DC | PRN

## 2016-10-01 MED ORDER — OXYCODONE-ACETAMINOPHEN 5-325 MG PO TABS
2.0000 | ORAL_TABLET | Freq: Once | ORAL | Status: AC
  Administered 2016-10-01: 2 via ORAL
  Filled 2016-10-01: qty 2

## 2016-10-01 MED ORDER — ONDANSETRON HCL 4 MG/2ML IJ SOLN
4.0000 mg | Freq: Once | INTRAMUSCULAR | Status: AC
  Administered 2016-10-01: 4 mg via INTRAVENOUS
  Filled 2016-10-01: qty 2

## 2016-10-01 MED ORDER — LIDOCAINE HCL (CARDIAC) 20 MG/ML IV SOLN
INTRAVENOUS | Status: AC
  Filled 2016-10-01: qty 5

## 2016-10-01 MED ORDER — HYDROMORPHONE HCL 1 MG/ML IJ SOLN
0.5000 mg | INTRAMUSCULAR | Status: DC | PRN
  Administered 2016-10-01: 1 mg via INTRAVENOUS
  Filled 2016-10-01: qty 1

## 2016-10-01 MED ORDER — ONDANSETRON 4 MG PO TBDP
4.0000 mg | ORAL_TABLET | Freq: Once | ORAL | Status: AC
  Administered 2016-10-01: 4 mg via ORAL
  Filled 2016-10-01: qty 1

## 2016-10-01 MED ORDER — GLYCOPYRROLATE 0.2 MG/ML IJ SOLN
INTRAMUSCULAR | Status: DC | PRN
  Administered 2016-10-01: 0.6 mg via INTRAVENOUS

## 2016-10-01 MED ORDER — 0.9 % SODIUM CHLORIDE (POUR BTL) OPTIME
TOPICAL | Status: DC | PRN
  Administered 2016-10-01: 1000 mL

## 2016-10-01 MED ORDER — MIDAZOLAM HCL 5 MG/5ML IJ SOLN
INTRAMUSCULAR | Status: DC | PRN
  Administered 2016-10-01: 2 mg via INTRAVENOUS

## 2016-10-01 MED ORDER — SODIUM CHLORIDE 0.9 % IR SOLN
Status: DC | PRN
  Administered 2016-10-01: 1000 mL

## 2016-10-01 SURGICAL SUPPLY — 42 items
APPLIER CLIP 5 13 M/L LIGAMAX5 (MISCELLANEOUS) ×3
BLADE CLIPPER SURG (BLADE) IMPLANT
CANISTER SUCT 3000ML PPV (MISCELLANEOUS) ×3 IMPLANT
CHLORAPREP W/TINT 26ML (MISCELLANEOUS) ×3 IMPLANT
CLIP APPLIE 5 13 M/L LIGAMAX5 (MISCELLANEOUS) ×1 IMPLANT
COVER MAYO STAND STRL (DRAPES) ×3 IMPLANT
COVER SURGICAL LIGHT HANDLE (MISCELLANEOUS) ×3 IMPLANT
DERMABOND ADVANCED (GAUZE/BANDAGES/DRESSINGS) ×2
DERMABOND ADVANCED .7 DNX12 (GAUZE/BANDAGES/DRESSINGS) ×1 IMPLANT
DRAPE C-ARM 42X72 X-RAY (DRAPES) ×3 IMPLANT
ELECT REM PT RETURN 9FT ADLT (ELECTROSURGICAL) ×3
ELECTRODE REM PT RTRN 9FT ADLT (ELECTROSURGICAL) ×1 IMPLANT
FILTER SMOKE EVAC LAPAROSHD (FILTER) IMPLANT
GLOVE BIO SURGEON STRL SZ8 (GLOVE) ×3 IMPLANT
GLOVE BIOGEL PI IND STRL 8 (GLOVE) ×1 IMPLANT
GLOVE BIOGEL PI INDICATOR 8 (GLOVE) ×2
GOWN STRL REUS W/ TWL LRG LVL3 (GOWN DISPOSABLE) ×2 IMPLANT
GOWN STRL REUS W/ TWL XL LVL3 (GOWN DISPOSABLE) ×1 IMPLANT
GOWN STRL REUS W/TWL LRG LVL3 (GOWN DISPOSABLE) ×4
GOWN STRL REUS W/TWL XL LVL3 (GOWN DISPOSABLE) ×2
KIT BASIN OR (CUSTOM PROCEDURE TRAY) ×3 IMPLANT
KIT ROOM TURNOVER OR (KITS) ×3 IMPLANT
L-HOOK LAP DISP 36CM (ELECTROSURGICAL) ×3
LHOOK LAP DISP 36CM (ELECTROSURGICAL) ×1 IMPLANT
NEEDLE 22X1 1/2 (OR ONLY) (NEEDLE) ×3 IMPLANT
NS IRRIG 1000ML POUR BTL (IV SOLUTION) ×3 IMPLANT
PAD ARMBOARD 7.5X6 YLW CONV (MISCELLANEOUS) ×3 IMPLANT
PENCIL BUTTON HOLSTER BLD 10FT (ELECTRODE) ×3 IMPLANT
POUCH RETRIEVAL ECOSAC 10 (ENDOMECHANICALS) ×1 IMPLANT
POUCH RETRIEVAL ECOSAC 10MM (ENDOMECHANICALS) ×2
SCISSORS LAP 5X35 DISP (ENDOMECHANICALS) ×3 IMPLANT
SET CHOLANGIOGRAPH 5 50 .035 (SET/KITS/TRAYS/PACK) ×3 IMPLANT
SET IRRIG TUBING LAPAROSCOPIC (IRRIGATION / IRRIGATOR) ×3 IMPLANT
SLEEVE ENDOPATH XCEL 5M (ENDOMECHANICALS) ×6 IMPLANT
SPECIMEN JAR SMALL (MISCELLANEOUS) ×3 IMPLANT
SUT VIC AB 4-0 PS2 27 (SUTURE) ×6 IMPLANT
TOWEL OR 17X24 6PK STRL BLUE (TOWEL DISPOSABLE) ×3 IMPLANT
TOWEL OR 17X26 10 PK STRL BLUE (TOWEL DISPOSABLE) ×3 IMPLANT
TRAY LAPAROSCOPIC MC (CUSTOM PROCEDURE TRAY) ×3 IMPLANT
TROCAR XCEL BLUNT TIP 100MML (ENDOMECHANICALS) ×3 IMPLANT
TROCAR XCEL NON-BLD 5MMX100MML (ENDOMECHANICALS) ×3 IMPLANT
TUBING INSUFFLATION (TUBING) ×3 IMPLANT

## 2016-10-01 NOTE — ED Notes (Signed)
Pt. Given water at this time

## 2016-10-01 NOTE — Anesthesia Procedure Notes (Signed)
Procedure Name: Intubation Date/Time: 10/01/2016 2:43 PM Performed by: Everlean Cherry A Pre-anesthesia Checklist: Patient identified, Emergency Drugs available, Suction available and Patient being monitored Patient Re-evaluated:Patient Re-evaluated prior to inductionOxygen Delivery Method: Circle system utilized Preoxygenation: Pre-oxygenation with 100% oxygen Intubation Type: IV induction and Rapid sequence Laryngoscope Size: Mac and 3 Grade View: Grade I Tube type: Oral Tube size: 7.5 mm Number of attempts: 1 Airway Equipment and Method: Stylet Placement Confirmation: ETT inserted through vocal cords under direct vision,  positive ETCO2 and breath sounds checked- equal and bilateral Secured at: 23 cm Tube secured with: Tape Dental Injury: Teeth and Oropharynx as per pre-operative assessment

## 2016-10-01 NOTE — Anesthesia Preprocedure Evaluation (Addendum)
Anesthesia Evaluation  Patient identified by MRN, date of birth, ID band Patient awake    Reviewed: Allergy & Precautions, H&P , NPO status , Patient's Chart, lab work & pertinent test results  Airway Mallampati: II  TM Distance: >3 FB Neck ROM: full    Dental no notable dental hx.    Pulmonary neg pulmonary ROS,    Pulmonary exam normal breath sounds clear to auscultation       Cardiovascular Exercise Tolerance: Good negative cardio ROS   Rhythm:regular Rate:Normal     Neuro/Psych negative neurological ROS  negative psych ROS   GI/Hepatic negative GI ROS, Neg liver ROS,   Endo/Other  negative endocrine ROSdiabetesMorbid obesity  Renal/GU negative Renal ROS  negative genitourinary   Musculoskeletal negative musculoskeletal ROS (+)   Abdominal   Peds negative pediatric ROS (+)  Hematology negative hematology ROS (+)   Anesthesia Other Findings   Reproductive/Obstetrics negative OB ROS                            Anesthesia Physical  Anesthesia Plan  ASA: III  Anesthesia Plan: General   Post-op Pain Management:    Induction: Intravenous  PONV Risk Score and Plan: 2 and Ondansetron and Dexamethasone  Airway Management Planned: Oral ETT  Additional Equipment:   Intra-op Plan:   Post-operative Plan: Extubation in OR  Informed Consent: I have reviewed the patients History and Physical, chart, labs and discussed the procedure including the risks, benefits and alternatives for the proposed anesthesia with the patient or authorized representative who has indicated his/her understanding and acceptance.   Dental Advisory Given  Plan Discussed with: CRNA  Anesthesia Plan Comments: (Lab work confirmed with CRNA in room. Platelets okay. Discussed spinal anesthetic, and patient consents to the procedure:  included risk of possible headache,backache, failed block, allergic reaction,  and nerve injury. This patient was asked if she had any questions or concerns before the procedure started. )        Anesthesia Quick Evaluation                                   Anesthesia Evaluation  Patient identified by MRN, date of birth, ID band Patient awake    Reviewed: Allergy & Precautions, H&P , Patient's Chart, lab work & pertinent test results  Airway Mallampati: II TM Distance: >3 FB Neck ROM: full    Dental No notable dental hx.    Pulmonary  breath sounds clear to auscultation  Pulmonary exam normal       Cardiovascular Exercise Tolerance: Good Rhythm:regular Rate:Normal     Neuro/Psych    GI/Hepatic   Endo/Other  diabetesMorbid obesity  Renal/GU      Musculoskeletal   Abdominal   Peds  Hematology   Anesthesia Other Findings Hemabate in room; discussed with [facility] CRNA  Reproductive/Obstetrics                          Anesthesia Physical Anesthesia Plan  ASA: III  Anesthesia Plan: Spinal   Post-op Pain Management:    Induction:   Airway Management Planned:   Additional Equipment:   Intra-op Plan:   Post-operative Plan:   Informed Consent: I have reviewed the patients History and Physical, chart, labs and discussed the procedure including the risks, benefits and alternatives for the proposed anesthesia with the patient  or authorized representative who has indicated his/her understanding and acceptance.   Dental Advisory Given  Plan Discussed with: CRNA  Anesthesia Plan Comments: (Lab work confirmed with CRNA in room. Platelets okay. Discussed spinal anesthetic, and patient consents to the procedure:  included risk of possible headache,backache, failed block, allergic reaction, and nerve injury. This patient was asked if she had any questions or concerns before the procedure started. )        Anesthesia Quick Evaluation

## 2016-10-01 NOTE — Transfer of Care (Signed)
Immediate Anesthesia Transfer of Care Note  Patient: Christie BeckerMaria G Medina Mendoza  Procedure(s) Performed: Procedure(s): LAPAROSCOPIC CHOLECYSTECTOMY (N/A)  Patient Location: PACU  Anesthesia Type:General  Level of Consciousness: alert , oriented, drowsy and patient cooperative  Airway & Oxygen Therapy: Patient Spontanous Breathing and Patient connected to nasal cannula oxygen  Post-op Assessment: Report given to RN and Post -op Vital signs reviewed and stable  Post vital signs: Reviewed and stable  Last Vitals:  Vitals:   10/01/16 1229 10/01/16 1608  BP: (!) 122/58   Pulse: (!) 55   Resp:    Temp: 36.9 C (P) 36.8 C    Last Pain:  Vitals:   10/01/16 1229  TempSrc: Oral  PainSc:          Complications: No apparent anesthesia complications

## 2016-10-01 NOTE — ED Provider Notes (Signed)
Care [facility] provider [facility] Please see note for further details. Case discussed, plan agreed upon. Briefly, patient is a 46 y.o. female who presented to ER for RUQ abdominal pain. Labs reviewed and reassuring. RUQ ultrasound pending. Will follow up on ultrasound and dispo appropriately.   Ultrasound reviewed: IMPRESSION: 1. Cholelithiasis without additional sonographic features to suggest acute cholecystitis. No biliary dilatation. 2. Increased hepatic echogenicity, suggestive of steatosis.  Discussed results with patient and family. Patient still complaining of pain and had episode of emesis. Will provide further pain/nausea control then reassess. If pain/nausea controlled, will discharge home with GenSurg follow up. If unable to tolerate fluids or uncontrollable pain, will consult surgery.   Patient re-evaluated. Had another episode of emesis and appears rather uncomfortable. Will consult surgery.   General surgery has evaluated patient and will admit.   Graylon Amory, Chase PicketJaime Pilcher, PA-C 10/01/16 1008    Lorre NickAllen, Anthony, MD 10/04/16 [phone]

## 2016-10-01 NOTE — ED Notes (Signed)
General surgery at bedside. 

## 2016-10-01 NOTE — Op Note (Signed)
10/01/2016  3:38 PM  PATIENT:  Christie Holder  46 y.o. female  PRE-OPERATIVE DIAGNOSIS:  Cholelithiasis  POST-OPERATIVE DIAGNOSIS:  Acute cholecystitis  PROCEDURE:  Procedure(s): LAPAROSCOPIC CHOLECYSTECTOMY  SURGEON:  Surgeon(s): Violeta Gelinashompson, Arhum Peeples, MD  ASSISTANTS: Bailey MechLiz Simaan, PAC   ANESTHESIA:   local and general  EBL:  Total I/O In: 1000 [I.V.:1000] Out: 315 [Other:300; Blood:15]  BLOOD ADMINISTERED:none  DRAINS: none   SPECIMEN:  Excision  DISPOSITION OF SPECIMEN:  PATHOLOGY  COUNTS:  YES  DICTATION: .Dragon Dictation [name] presents for cholecystectomy. She was identified the preop holding area. Informed consent was obtained. She received intravenous antibiotics. She was brought to the operating room and general endotracheal anesthesia was administered by the anesthesia staff. Her abdomen was prepped and draped in a sterile fashion. We did a time out procedure.The infraumbilical region was infiltrated with local. Infraumbilical incision was made. Subcutaneous tissues were dissected down revealing the anterior fascia. This was divided sharply along the midline. Peritoneal cavity was entered under direct vision without complication. A 0 Vicryl pursestring was placed around the fascial opening. Hassan trocar was inserted into the abdomen. The abdomen was insufflated with carbon dioxide in standard fashion. Under direct vision a 5 mm epigastric and 2 right-sided 5 mm ports were placed. Local was used at each port site. Laparoscopic exploration revealed a lot of omental adhesions to the gallbladder with acute inflammation consistent with acute cholecystitis. The omental adhesions were gradually stripped down off of the gallbladder using cautery good hemostasis. The dome was retracted superior medially. The infundibulum was retracted inferolaterally. Dissection began laterally and progressed medially identifying the cystic duct. Dissection continued until a critical view between  the cystic duct, the liver, in the gallbladder was achieved. Once this was accomplished, 3 clips were placed proximally on the cystic duct and one was placed distally. It was divided. An anterior branch of the cystic artery was clipped twice proximally and divided. A posterior branch of the cystic artery was also clipped and divided. The gallbladder was taken off the liver bed using Bovie cautery. Was placed in a bag and removed from the abdomen. The gallbladder was sent to pathology. Liver bed was cauterized to get excellent hemostasis. The area was irrigated. Liver bed was dry and clips remain in good position. Ports were removed under direct vision. Pneumoperitoneum was released. Infraumbilical fascia was closed by tying the pursestring. All 4 wounds were copiously irrigated and the skin of each was closed with running 4-0 Vicryl subcuticular. Dermabond was placed. All counts were correct. She tolerated the procedure well without apparent complications and was taken to recovery in stable condition.  PATIENT DISPOSITION:  PACU - hemodynamically stable.   Delay start of Pharmacological VTE agent (>24hrs) due to surgical blood loss or risk of bleeding:  no  Violeta GelinasBurke Demetrias Goodbar, MD, MPH, FACS Pager: [phone]  7/10/20183:38 PM

## 2016-10-01 NOTE — ED Provider Notes (Signed)
[facility]-EMERGENCY DEPT Provider Note   CSN: [id number] Arrival date & time: 10/01/16  0108     History   Chief Complaint Chief Complaint  Patient presents with  . Flank Pain    HPI Christie Holder is a 46 y.o. female.  Patient presents to the emergency department with chief complaint of right upper quadrant abdominal pain. She states the pain started about a week ago and has gradually worsened throughout the week. She states yesterday became sister that she had to come to the emergency department. She reports associated nausea and vomiting, but denies any fevers or chills.  She states that the pain radiates to her back.  She has not taken anything for the symptoms.  The symptoms are worsened with eating and palpation.   The history is provided by the patient. No language interpreter was used.    Past Medical History:  Diagnosis Date  . Gestational diabetes 10/10/2011    Patient Active Problem List   Diagnosis Date Noted  . Complete miscarriage 06/23/2013  . History of macrosomia in infant in prior pregnancy, currently pregnant 12/02/2011  . Obesity 07/04/2011    Past Surgical History:  Procedure Laterality Date  . CESAREAN SECTION  12/19/2011   Procedure: CESAREAN SECTION;  Surgeon: Willodean Rosenthalarolyn Harraway-Smith, MD;  Location: [facility];  Service: Obstetrics;  Laterality: N/A;  . NO PAST SURGERIES      OB History    Gravida Para Term Preterm AB Living   8 7 7   1 7    SAB TAB Ectopic Multiple Live Births   1       7       Home Medications    Prior to Admission medications   Medication Sig Start Date End Date Taking? Authorizing Provider  acetaminophen (TYLENOL) 500 MG tablet Take 1,000 mg by mouth every 6 (six) hours as needed for mild pain.    [provider]  benzonatate (TESSALON) 100 MG capsule Take 1 capsule (100 mg total) by mouth every 8 (eight) hours. 05/12/16   Janne NapoleonNeese, Hope M, NP  cephALEXin (KEFLEX) 500 MG capsule Take 1 capsule (500 mg total) by  mouth 4 (four) times daily. Take all of medicine and drink lots of fluids 09/19/15   Linna HoffKindl, James D, MD  HYDROcodone-acetaminophen (NORCO/VICODIN) 5-325 MG per tablet Take 1 tablet by mouth once. 03/05/14   Earley FavorSchulz, Gail, NP  ibuprofen (ADVIL,MOTRIN) 600 MG tablet Take 1 tablet (600 mg total) by mouth every 6 (six) hours as needed for cramping. 07/16/13   Katrinka BlazingSmith, [place], CNM  Multiple Vitamin (MULTIVITAMIN WITH MINERALS) TABS tablet Take 1 tablet by mouth daily.    [provider]  naproxen (NAPROSYN) 500 MG tablet Take 1 tablet (500 mg total) by mouth 2 (two) times daily. 05/12/16   Janne NapoleonNeese, Hope M, NP  norethindrone (MICRONOR,CAMILA,ERRIN) 0.35 MG tablet Take 1 tablet by mouth daily.    [provider]  omega-3 acid ethyl esters (LOVAZA) 1 G capsule Take 1 g by mouth 2 (two) times daily.    [provider]  ondansetron (ZOFRAN ODT) 4 MG disintegrating tablet Take 1 tablet (4 mg total) by mouth every 8 (eight) hours as needed for nausea or vomiting. 03/05/14   Earley FavorSchulz, Gail, NP  oxyCODONE-acetaminophen (PERCOCET/ROXICET) 5-325 MG per tablet Take 1-2 tablets by mouth every 4 (four) hours as needed for severe pain. 07/16/13   Katrinka BlazingSmith, [place], CNM  promethazine (PHENERGAN) 25 MG tablet Take 1 tablet (25 mg total) by mouth every 6 (  six) hours as needed for nausea or vomiting. 05/12/16   Janne Napoleon, NP    Family History Family History  Problem Relation Age of Onset  . Diabetes Maternal Grandmother     Social History Social History  Substance Use Topics  . Smoking status: Never Smoker  . Smokeless tobacco: Never Used  . Alcohol use No     Allergies   Patient has no known allergies.   Review of Systems Review of Systems  All other systems reviewed and are negative.    Physical Exam Updated Vital Signs BP 115/75 (BP Location: Left Arm)   Pulse 65   Temp 98.3 F (36.8 C) (Oral)   Resp 16   Ht 5\' 3"  (1.6 m)   Wt 114.8 kg (253 lb)   LMP 09/07/2016   SpO2 98%    BMI 44.82 kg/m   Physical Exam  Constitutional: She is oriented to person, place, and time. She appears well-developed and well-nourished.  HENT:  Head: Normocephalic and atraumatic.  Eyes: Conjunctivae and EOM are normal. Pupils are equal, round, and reactive to light.  Neck: Normal range of motion. Neck supple.  Cardiovascular: Normal rate and regular rhythm.  Exam reveals no gallop and no friction rub.   No murmur heard. Pulmonary/Chest: Effort normal and breath sounds normal. No respiratory distress. She has no wheezes. She has no rales. She exhibits no tenderness.  Abdominal: Soft. Bowel sounds are normal. She exhibits no distension and no mass. There is tenderness. There is no rebound and no guarding.  RUQ ttp  Musculoskeletal: Normal range of motion. She exhibits no edema or tenderness.  Neurological: She is alert and oriented to person, place, and time.  Skin: Skin is warm and dry.  Psychiatric: She has a normal mood and affect. Her behavior is normal. Judgment and thought content normal.  Nursing note and vitals reviewed.    ED Treatments / Results  Labs (all labs ordered are listed, but only abnormal results are displayed) Labs Reviewed  URINALYSIS, ROUTINE W REFLEX MICROSCOPIC - Abnormal; Notable for the following:       Result Value   Hgb urine dipstick SMALL (*)    Bacteria, UA RARE (*)    Squamous Epithelial / LPF 0-5 (*)    All other components within normal limits  BASIC METABOLIC PANEL - Abnormal; Notable for the following:    Sodium 134 (*)    Glucose, Bld 119 (*)    All other components within normal limits  CBC - Abnormal; Notable for the following:    WBC 11.8 (*)    All other components within normal limits  HEPATIC FUNCTION PANEL  POC URINE PREG, ED    EKG  EKG Interpretation None       Radiology No results found.  Procedures Procedures (including critical care time)  Medications Ordered in ED Medications  oxyCODONE-acetaminophen  (PERCOCET/ROXICET) 5-325 MG per tablet 1 tablet (1 tablet Oral Given 10/01/16 0125)  ondansetron (ZOFRAN-ODT) 4 MG disintegrating tablet (not administered)  oxyCODONE-acetaminophen (PERCOCET/ROXICET) 5-325 MG per tablet (not administered)  HYDROmorphone (DILAUDID) injection 1 mg (not administered)  ondansetron (ZOFRAN) injection 4 mg (not administered)  ondansetron (ZOFRAN-ODT) disintegrating tablet 8 mg (8 mg Oral Given 10/01/16 0125)     Initial Impression / Assessment and Plan / ED Course  I have reviewed the triage vital signs and the nursing notes.  Pertinent labs & imaging results that were available during my care of the patient were reviewed by me and considered  in my medical decision making (see chart for details).     Patient with RUQ abdominal pain, n/v.  Will add LFTs to triage BMP.  Will check [place] RUQ.  Treat pain.     Patient signed out to Ward, PA-C, who will continue care and follow-up on [place].   Final Clinical Impressions(s) / ED Diagnoses   Final diagnoses:  None    New Prescriptions New Prescriptions   No medications on file     Roxy Horseman, Cordelia Poche 10/01/16 1610    Derwood Kaplan, MD 10/02/16 9604

## 2016-10-01 NOTE — H&P (Signed)
[facility] Admission Note  Christie Holder 28-Mar-1970  [id number].    Requesting MD: Pearlie Oyster PA-C Chief Complaint/Reason for Consult: symptomatic cholelithiasis HPI:  Patient is a 46 y.o. Female who presented to [facility] with RUQ abdominal pain that began last night and had progressively worsened. She states pain is sharp, constant and radiates to her right flank. Reports associated nausea, bilious vomiting, diarrhea, dizziness and headache. She has had episodes of this in the past but it always resolved, notes that the pain always came on after eating. Denies fever, chills, chest pain, SOB, urinary symptoms. Patient has no other chronic medical conditions and takes no daily medications. Surgical history includes a cesarean in 2013. Last had a few sips of water around 7:30 this AM in the ED to take some pain medication. NKDA. Daughter was present at bedside.   ROS: Review of Systems  Constitutional: Negative for chills, fever and weight loss.  Respiratory: Negative for shortness of breath and wheezing.   Cardiovascular: Negative for chest pain and palpitations.  Gastrointestinal: Positive for abdominal pain (RUQ radiating to right flank), diarrhea, nausea and vomiting. Negative for blood in stool, heartburn and melena.  Genitourinary: Negative for dysuria, frequency and urgency.  Neurological: Positive for dizziness and headaches. Negative for loss of consciousness.  All other systems reviewed and are negative.   Family History  Problem Relation Age of Onset  . Diabetes Maternal Grandmother     Past Medical History:  Diagnosis Date  . Gestational diabetes 10/10/2011    Past Surgical History:  Procedure Laterality Date  . CESAREAN SECTION  12/19/2011   Procedure: CESAREAN SECTION;  Surgeon: Lavonia Drafts, MD;  Location: [facility];  Service: Obstetrics;  Laterality: N/A;  . NO PAST SURGERIES      Social History:  reports that she has never smoked. She has  never used smokeless tobacco. She reports that she does not drink alcohol or use drugs.  Allergies: No Known Allergies   (Not in a hospital admission)  Blood pressure (!) 122/94, pulse (!) 57, temperature 98.2 F (36.8 C), temperature source Oral, resp. rate 17, height '5\' 3"'$  (1.6 m), weight 114.8 kg (253 lb), last menstrual period 09/07/2016, SpO2 100 %. Physical Exam: Physical Exam  Constitutional: She is oriented to person, place, and time. She appears well-developed and well-nourished. She is cooperative.  Non-toxic appearance. No distress.  HENT:  Head: Normocephalic and atraumatic.  Right Ear: External ear normal.  Left Ear: External ear normal.  Nose: Nose normal.  Mouth/Throat: Oropharynx is clear and moist and mucous membranes are normal.  Eyes: Conjunctivae and lids are normal. No scleral icterus.  Pupils equal and round  Neck: Trachea normal and normal range of motion. Neck supple.  Cardiovascular: Normal rate, regular rhythm, S1 normal and S2 normal.   Pulses:      Radial pulses are 2+ on the right side, and 2+ on the left side.       Dorsalis pedis pulses are 2+ on the right side, and 2+ on the left side.  Pulmonary/Chest: Effort normal. She has no decreased breath sounds. She has no wheezes. She has no rhonchi. She has no rales.  Abdominal: Soft. Bowel sounds are normal. She exhibits no distension and no mass. There is tenderness in the right upper quadrant. There is no rigidity, no rebound, no guarding and negative Murphy's sign.  Musculoskeletal: Normal range of motion.  No gross deformities or edema  Neurological: She is alert and oriented to person,  place, and time. She has normal strength. No sensory deficit.  Skin: Skin is warm, dry and intact. She is not diaphoretic. No pallor.  Psychiatric: Her speech is normal and behavior is normal. Judgment normal.    Results for orders placed or performed during the hospital encounter of 10/01/16 (from the past 48 hour(s))   Basic metabolic panel     Status: Abnormal   Collection Time: 10/01/16  1:31 AM  Result Value Ref Range   Sodium 134 (L) 135 - 145 mmol/L   Potassium 3.7 3.5 - 5.1 mmol/L   Chloride 104 101 - 111 mmol/L   CO2 22 22 - 32 mmol/L   Glucose, Bld 119 (H) 65 - 99 mg/dL   BUN 15 6 - 20 mg/dL   Creatinine, Ser 9.67 0.44 - 1.00 mg/dL   Calcium 9.0 8.9 - 00.0 mg/dL   GFR calc non Af Amer >60 >60 mL/min   GFR calc Af Amer >60 >60 mL/min    Comment: (NOTE) The eGFR has been calculated using the CKD EPI equation. This calculation has not been validated in all clinical situations. eGFR's persistently <60 mL/min signify possible Chronic Kidney Disease.    Anion gap 8 5 - 15  CBC     Status: Abnormal   Collection Time: 10/01/16  1:31 AM  Result Value Ref Range   WBC 11.8 (H) 4.0 - 10.5 K/uL   RBC 4.03 3.87 - 5.11 MIL/uL   Hemoglobin 13.2 12.0 - 15.0 g/dL   HCT 47.6 73.7 - 84.5 %   MCV 93.5 78.0 - 100.0 fL   MCH 32.8 26.0 - 34.0 pg   MCHC 35.0 30.0 - 36.0 g/dL   RDW 30.6 31.6 - 77.7 %   Platelets 272 150 - 400 K/uL  Urinalysis, Routine w reflex microscopic- may I&O cath if menses     Status: Abnormal   Collection Time: 10/01/16  1:32 AM  Result Value Ref Range   Color, Urine YELLOW YELLOW   APPearance CLEAR CLEAR   Specific Gravity, Urine 1.021 1.005 - 1.030   pH 6.0 5.0 - 8.0   Glucose, UA NEGATIVE NEGATIVE mg/dL   Hgb urine dipstick SMALL (A) NEGATIVE   Bilirubin Urine NEGATIVE NEGATIVE   Ketones, ur NEGATIVE NEGATIVE mg/dL   Protein, ur NEGATIVE NEGATIVE mg/dL   Nitrite NEGATIVE NEGATIVE   Leukocytes, UA NEGATIVE NEGATIVE   RBC / HPF 0-5 0 - 5 RBC/hpf   WBC, UA 0-5 0 - 5 WBC/hpf   Bacteria, UA RARE (A) NONE SEEN   Squamous Epithelial / LPF 0-5 (A) NONE SEEN   Mucous PRESENT   POC urine preg, ED     Status: None   Collection Time: 10/01/16  1:39 AM  Result Value Ref Range   Preg Test, Ur NEGATIVE NEGATIVE    Comment:        THE SENSITIVITY OF THIS METHODOLOGY IS >24  mIU/mL   Hepatic function panel     Status: None   Collection Time: 10/01/16  5:37 AM  Result Value Ref Range   Total Protein 7.3 6.5 - 8.1 g/dL   Albumin 4.0 3.5 - 5.0 g/dL   AST 26 15 - 41 U/L   ALT 28 14 - 54 U/L   Alkaline Phosphatase 74 38 - 126 U/L   Total Bilirubin 0.6 0.3 - 1.2 mg/dL   Bilirubin, Direct 0.1 0.1 - 0.5 mg/dL   Indirect Bilirubin 0.5 0.3 - 0.9 mg/dL   US Abdomen Limited  Result  Date: 10/01/2016 CLINICAL DATA:  Initial evaluation for acute right upper quadrant pain for 1 week. EXAM: ULTRASOUND ABDOMEN LIMITED RIGHT UPPER QUADRANT COMPARISON:  None available. FINDINGS: Gallbladder: Multiple echogenic stone seen layering within the gallbladder lumen, largest of which measures 9 mm. The gallbladder wall measured at the upper limits of normal at 3.4 mm. No free pericholecystic fluid. No sonographic Murphy sign elicited on exam. Common bile duct: Diameter: Upper limits of normal at 6.1 mm. No echogenic stone to suggest choledocholithiasis. Liver: Mildly heterogeneous without focal lesion. Increased hepatic echogenicity. IMPRESSION: 1. Cholelithiasis without additional sonographic features to suggest acute cholecystitis. No biliary dilatation. 2. Increased hepatic echogenicity, suggestive of steatosis. Electronically Signed   By: Jeannine Boga M.D.   On: 10/01/2016 07:03      Assessment/Plan Symptomatic Cholelithiasis - WBC 11.8, afebrile - patient TTP of RUQ - [place]: Cholelithiasis without additional sonographic features to suggest acute cholecystitis. No biliary dilatation - plan for lap chole +/- IOC later today - discussed procedure risks/benefits with patient who voiced understanding  - admit to med-surg  FEN- NPO/IVF VTE - SCDs ID - 2g ANCEF for surgical prophylaxis  Plan: To OR for lap chole this afternoon   Brigid Re, [facility] 10/01/2016, 9:42 AM Pager: [phone] Consults: [phone] Mon-Fri 7:00 am-4:30 pm Sat-Sun 7:00  am-11:30 am

## 2016-10-01 NOTE — ED Triage Notes (Signed)
Pt c/o 10/10 right side flank pain, denies any urinary symptoms states she is very nauseated, no fever.

## 2016-10-02 ENCOUNTER — Encounter (HOSPITAL_COMMUNITY): Payer: Self-pay | Admitting: General Surgery

## 2016-10-02 DIAGNOSIS — K8020 Calculus of gallbladder without cholecystitis without obstruction: Secondary | ICD-10-CM | POA: Diagnosis present

## 2016-10-02 LAB — BASIC METABOLIC PANEL WITH GFR
Anion gap: 6 (ref 5–15)
BUN: 6 mg/dL (ref 6–20)
CO2: 23 mmol/L (ref 22–32)
Calcium: 8.8 mg/dL — ABNORMAL LOW (ref 8.9–10.3)
Chloride: 104 mmol/L (ref 101–111)
Creatinine, Ser: 0.51 mg/dL (ref 0.44–1.00)
GFR calc Af Amer: >60 mL/min
GFR calc non Af Amer: >60 mL/min
Glucose, Bld: 144 mg/dL — ABNORMAL HIGH (ref 65–99)
Potassium: 3.9 mmol/L (ref 3.5–5.1)
Sodium: 133 mmol/L — ABNORMAL LOW (ref 135–145)

## 2016-10-02 LAB — CBC
HCT: 35.8 % — ABNORMAL LOW (ref 36.0–46.0)
Hemoglobin: 12.1 g/dL (ref 12.0–15.0)
MCH: 31.8 pg (ref 26.0–34.0)
MCHC: 33.8 g/dL (ref 30.0–36.0)
MCV: 94.2 fL (ref 78.0–100.0)
PLATELETS: 259 10*3/uL (ref 150–400)
RBC: 3.8 MIL/uL — AB (ref 3.87–5.11)
RDW: 12.6 % (ref 11.5–15.5)
WBC: 10.2 10*3/uL (ref 4.0–10.5)

## 2016-10-02 LAB — HIV ANTIBODY (ROUTINE TESTING W REFLEX): HIV Screen 4th Generation wRfx: NONREACTIVE

## 2016-10-02 MED ORDER — ACETAMINOPHEN 325 MG PO TABS
650.0000 mg | ORAL_TABLET | Freq: Four times a day (QID) | ORAL | Status: DC | PRN
  Administered 2016-10-02: 650 mg via ORAL
  Filled 2016-10-02: qty 2

## 2016-10-02 MED ORDER — OXYCODONE HCL 5 MG PO TABS: 5.0000 mg | ORAL_TABLET | ORAL | 0 refills | Status: DC | PRN

## 2016-10-02 NOTE — Discharge Summary (Signed)
[facility] Discharge Summary   Patient ID: Christie BeckerMaria G Medina Holder MRN: [id number] DOB/AGE: 44/04/1970 44 y.o.  Admit date: 10/01/2016 Discharge date: 10/02/2016  Admitting Diagnosis: Symptomatic cholelithiasis  Discharge Diagnosis Patient Active Problem List   Diagnosis Date Noted  . Symptomatic cholelithiasis 10/01/2016  . Complete miscarriage 06/23/2013  . History of macrosomia in infant in prior pregnancy, currently pregnant 12/02/2011  . Obesity 07/04/2011    Consultants none  Imaging: Koreas Abdomen Limited  Result Date: 10/01/2016 CLINICAL DATA:  Initial evaluation for acute right upper quadrant pain for 1 week. EXAM: ULTRASOUND ABDOMEN LIMITED RIGHT UPPER QUADRANT COMPARISON:  None available. FINDINGS: Gallbladder: Multiple echogenic stone seen layering within the gallbladder lumen, largest of which measures 9 mm. The gallbladder wall measured at the upper limits of normal at 3.4 mm. No free pericholecystic fluid. No sonographic Murphy sign elicited on exam. Common bile duct: Diameter: Upper limits of normal at 6.1 mm. No echogenic stone to suggest choledocholithiasis. Liver: Mildly heterogeneous without focal lesion. Increased hepatic echogenicity. IMPRESSION: 1. Cholelithiasis without additional sonographic features to suggest acute cholecystitis. No biliary dilatation. 2. Increased hepatic echogenicity, suggestive of steatosis. Electronically Signed   By: Rise MuBenjamin  McClintock M.D.   On: 10/01/2016 07:03    Procedures Dr. Janee Mornhompson (10/01/16) - Laparoscopic Cholecystectomy  Hospital Course:  46 year old female [facility] .  Workup showed symptomatic cholelithiases.  Patient was admitted and underwent procedure listed above.  Tolerated procedure well and was transferred to the floor.  Diet was advanced as tolerated.  On POD#1, the patient was voiding well, tolerating diet, ambulating well, pain well controlled, vital signs stable, incisions  c/d/i and felt stable for discharge home.  Patient will follow up in our office in 2 weeks and knows to call with questions or concerns.  She will call to confirm appointment date/time.     Allergies as of 10/02/2016   No Known Allergies     Medication List    STOP taking these medications   benzonatate 100 MG capsule Commonly known as:  TESSALON   cephALEXin 500 MG capsule Commonly known as:  KEFLEX   HYDROcodone-acetaminophen 5-325 MG tablet Commonly known as:  NORCO/VICODIN   naproxen 500 MG tablet Commonly known as:  NAPROSYN   ondansetron 4 MG disintegrating tablet Commonly known as:  ZOFRAN ODT   oxyCODONE-acetaminophen 5-325 MG tablet Commonly known as:  PERCOCET/ROXICET   promethazine 25 MG tablet Commonly known as:  PHENERGAN     TAKE these medications   ibuprofen 600 MG tablet Commonly known as:  ADVIL,MOTRIN Take 1 tablet (600 mg total) by mouth every 6 (six) hours as needed for cramping.   multivitamin with minerals Tabs tablet Take 1 tablet by mouth daily.   omega-3 acid ethyl esters 1 g capsule Commonly known as:  LOVAZA Take 1 g by mouth 2 (two) times daily.   oxyCODONE 5 MG immediate release tablet Commonly known as:  Oxy IR/ROXICODONE Take 1-2 tablets (5-10 mg total) by mouth every 4 (four) hours as needed for severe pain.        Follow-up Information    [facility]   Specialty:  General Surgery Why:  Call to confirm appt date and time. Please arrive 30 mins prior to appt and bring photo ID and insurance.  Contact information: [address] [place] [address] [phone]           Signed: Hedda SladePuja Rayetta Veith, PA-C [facility] 10/02/2016, 10:18 AM

## 2016-10-02 NOTE — Anesthesia Postprocedure Evaluation (Signed)
Anesthesia Post Note  Patient: Christie BeckerMaria G Medina Mendoza  Procedure(s) Performed: Procedure(s) (LRB): LAPAROSCOPIC CHOLECYSTECTOMY (N/A)     Patient location during evaluation: PACU Anesthesia Type: General Level of consciousness: awake and alert Pain management: pain level controlled Vital Signs Assessment: post-procedure vital signs reviewed and stable Respiratory status: spontaneous breathing, nonlabored ventilation, respiratory function stable and patient connected to nasal cannula oxygen Cardiovascular status: blood pressure returned to baseline and stable Postop Assessment: no signs of nausea or vomiting Anesthetic complications: no    Last Vitals:  Vitals:   10/02/16 0000 10/02/16 0400  BP: 108/66 117/65  Pulse: 74 84  Resp: 18 18  Temp: 36.8 C 36.7 C    Last Pain:  Vitals:   10/02/16 0829  TempSrc:   PainSc: 3                  [place]

## 2016-10-02 NOTE — Progress Notes (Signed)
1 Day Post-Op   Subjective/Chief Complaint: Doing well, ate breakfast   Objective: Vital signs in last 24 hours: Temp:  [97 F (36.1 C)-98.5 F (36.9 C)] 98.1 F (36.7 C) (07/11 0400) Pulse Rate:  [51-88] 84 (07/11 0400) Resp:  [17-28] 18 (07/11 0400) BP: (108-134)/(58-94) 117/65 (07/11 0400) SpO2:  [84 %-100 %] 98 % (07/11 0400) Last BM Date: 09/30/16  Intake/Output from previous day: 07/10 0701 - 07/11 0700 In: 1457.3 [P.O.:120; I.V.:1337.3] Out: 325 [Blood:25] Intake/Output this shift: No intake/output data recorded.  General appearance: alert and cooperative GI: soft, incisions OK  Lab Results:   Recent Labs  10/01/16 0131 10/02/16 0320  WBC 11.8* 10.2  HGB 13.2 12.1  HCT 37.7 35.8*  PLT 272 259   BMET  Recent Labs  10/01/16 0131 10/02/16 0320  NA 134* 133*  K 3.7 3.9  CL 104 104  CO2 22 23  GLUCOSE 119* 144*  BUN 15 6  CREATININE 0.72 0.51  CALCIUM 9.0 8.8*   PT/INR No results for input(s): LABPROT, INR in the last 72 hours. ABG No results for input(s): PHART, HCO3 in the last 72 hours.  Invalid input(s): PCO2, PO2  Studies/Results: Koreas Abdomen Limited  Result Date: 10/01/2016 CLINICAL DATA:  Initial evaluation for acute right upper quadrant pain for 1 week. EXAM: ULTRASOUND ABDOMEN LIMITED RIGHT UPPER QUADRANT COMPARISON:  None available. FINDINGS: Gallbladder: Multiple echogenic stone seen layering within the gallbladder lumen, largest of which measures 9 mm. The gallbladder wall measured at the upper limits of normal at 3.4 mm. No free pericholecystic fluid. No sonographic Murphy sign elicited on exam. Common bile duct: Diameter: Upper limits of normal at 6.1 mm. No echogenic stone to suggest choledocholithiasis. Liver: Mildly heterogeneous without focal lesion. Increased hepatic echogenicity. IMPRESSION: 1. Cholelithiasis without additional sonographic features to suggest acute cholecystitis. No biliary dilatation. 2. Increased hepatic  echogenicity, suggestive of steatosis. Electronically Signed   By: Rise MuBenjamin  McClintock M.D.   On: 10/01/2016 07:03    Anti-infectives: Anti-infectives    Start     Dose/Rate Route Frequency Ordered Stop   10/01/16 1015  cefTRIAXone (ROCEPHIN) 2 g in dextrose 5 % 50 mL IVPB  Status:  Discontinued     2 g 100 mL/hr over 30 Minutes Intravenous Every 24 hours 10/01/16 1008 10/01/16 1659      Assessment/Plan: s/p Procedure(s): LAPAROSCOPIC CHOLECYSTECTOMY (N/A) POD#1 D/C  LOS: 1 day    Christie Holder 10/02/2016

## 2016-10-02 NOTE — Care Management Note (Signed)
Case Management Note  Patient Details  Name: [name] MRN: [id number] Date of Birth: 09/13/1970  Subjective/Objective:                    Action/Plan: Patient discharged home with self care. Pt has insurance, transportation home and hospital follow up. No further needs per CM.   Expected Discharge Date:  10/02/16               Expected Discharge Plan:  Home/Self Care  In-House Referral:     Discharge planning Services     Post Acute Care Choice:    Choice offered to:     DME Arranged:    DME Agency:     HH Arranged:    HH Agency:     Status of Service:  Completed, signed off  If discussed at [employer] Length of Stay Meetings, dates discussed:    Additional Comments:  Kermit BaloKelli F Corrado Hymon, RN 10/02/2016, 12:09 PM

## 2016-10-02 NOTE — Discharge Instructions (Signed)
CIRUGIA LAPAROSCOPICA: INSTRUCCIONES DE POST OPERATORIO. ° °Revise siempre los documentos que le entreguen en el lugar donde se ha hecho la cirugia. ° °SI USTED NECESITA DOCUMENTOS DE INCAPACIDAD (DISABLE) O DE PERMISO FAMILAR (FAMILY LEAVE) NECESITA TRAERLOS A LA OFICINA PARA QUE SEAN PROCESADOS. °NO  SE LOS DE A SU DOCTOR. °1. A su alta del hospital se le dara una receta para controlar el dolor. Tomela como ha sido recetada, si la necesita. Si no la necesita puede tomar, Acetaminofen (Tylenol) o Ibuprofen (Advil) para aliviar dolor moderado. °2. Continue tomando el resto de sus medicinas. °3. Si necesita rellenar la receta, llame a la farmacia. ellos contactan a nuestra oficina pidiendo autorizacion. Este tipo de receta no pueden ser rellenadas despues de las  5pm o durante los fines de semana. °4. Con relacion a la dieta: debe ser ligera los primeros dias despues que llege a la casa. Ejemplo: sopas y galleticas. Tome bastante liquido esos dias. °5. La mayoria de los pacientes padecen de inflamacion y cambio de coloracion de la piel alrededor de las incisiones. esto toma dias en resolver.  pnerse una bolsa de hielo en el area affectada ayuda..  °6. Es comun tambien tener un poco de estrenimiento si esta tomado medicinas para el dolor. incremente la cantidad de liquidos a tomar y puede tomar (Colace) esto previene el problema. Si ya tiene estrenimiento, es decir no ha defecado en 48 horas, puede tomar un laxativo (Milk of Magnesia or Miralax) uselo como el paquete le explica. °7.  A menos que se le diga algo diferente. Remueva el bendaje a las 24-48 horas despues dela cirugia. y puede banarse en la ducha sin ningun problema. usted puede tener steri-strips (pequenas curitas transparentes en la piel puesta encima de la incision)  Estas banditas strips should be left on the skin for 7-10 days.   Si su cirujano puso pegamento encima de la incision usted puede banarse bajo la ducha en 24 horas. Este pegamento empezara a  caerse en las proximas 2-3 semanas. Si le pusieron suturas o presillas (grapos) estos seran quitados en su proxima cita en la oficina. . °a. ACTIVIDADES:  Puede hacer actividad ligera.  Como caminar , subir escaleras y poco a poco irlas incrementando tanto como las tolere. Puede tener relaciones sexuales cuando sea comfortable. No carge objetos pesados o haga esfuerzos que no sean aprovados por su doctor. °b. Puede manejar en cuanto no esta tomando medicamentos fuertes (narcoticos) para el dolor, pueda abrochar confortablemente el cinturon de seguridad, y pueda maniobrar y usar los pedales de su vehiculo con seguridad. °c. PUEDE REGRESAR A TRABAJAR  °8. Debe ver a su doctor para una cita de seguimiento en 2-3 semanas despues de la cirugia.  °9. OTRAS ISNSTRUCCIONES:___________________________________________________________________________________ °CUANDO LLAMAR A SU MEDICO: °1. FIEBRE mayor de  101.0 °2. No produccion de orina. °3. Sangramiento continue de la herida °4. Incremento de dolor, enrojecimientio o drenaje de la herida (incision) °5. Incremento de dolor abdominal. ° °The clinic staff is available to answer your questions during regular business hours.  Please don’t hesitate to call and ask to speak to one of the nurses for clinical concerns.  If you have a medical emergency, go to the nearest emergency room or call 911.  A surgeon from [facility] is always on call at the hospital. °[address], [address], [place], [place]  [address] ? [address] [address], [place], [place]   [address] °[phone] ? [phone] ? FAX [phone] °Web site: [email or web address] ° ° °

## 2017-09-09 ENCOUNTER — Ambulatory Visit: Payer: Self-pay | Admitting: Family Medicine

## 2018-09-26 ENCOUNTER — Emergency Department (HOSPITAL_BASED_OUTPATIENT_CLINIC_OR_DEPARTMENT_OTHER): Payer: Self-pay

## 2018-09-26 ENCOUNTER — Emergency Department (HOSPITAL_COMMUNITY)
Admission: EM | Admit: 2018-09-26 | Discharge: 2018-09-26 | Disposition: A | Discharge status: Home / Self Care | Payer: Self-pay | Attending: Emergency Medicine | Admitting: Emergency Medicine

## 2018-09-26 ENCOUNTER — Emergency Department (HOSPITAL_COMMUNITY): Payer: Self-pay

## 2018-09-26 ENCOUNTER — Other Ambulatory Visit: Payer: Self-pay

## 2018-09-26 ENCOUNTER — Encounter (HOSPITAL_COMMUNITY): Payer: Self-pay

## 2018-09-26 DIAGNOSIS — M79605 Pain in left leg: Secondary | ICD-10-CM | POA: Insufficient documentation

## 2018-09-26 DIAGNOSIS — R42 Dizziness and giddiness: Secondary | ICD-10-CM | POA: Insufficient documentation

## 2018-09-26 DIAGNOSIS — R072 Precordial pain: Secondary | ICD-10-CM | POA: Insufficient documentation

## 2018-09-26 DIAGNOSIS — R0602 Shortness of breath: Secondary | ICD-10-CM | POA: Insufficient documentation

## 2018-09-26 DIAGNOSIS — R61 Generalized hyperhidrosis: Secondary | ICD-10-CM | POA: Insufficient documentation

## 2018-09-26 DIAGNOSIS — R112 Nausea with vomiting, unspecified: Secondary | ICD-10-CM | POA: Insufficient documentation

## 2018-09-26 DIAGNOSIS — M549 Dorsalgia, unspecified: Secondary | ICD-10-CM | POA: Insufficient documentation

## 2018-09-26 DIAGNOSIS — M79602 Pain in left arm: Secondary | ICD-10-CM | POA: Insufficient documentation

## 2018-09-26 DIAGNOSIS — Z20828 Contact with and (suspected) exposure to other viral communicable diseases: Secondary | ICD-10-CM | POA: Insufficient documentation

## 2018-09-26 DIAGNOSIS — R2243 Localized swelling, mass and lump, lower limb, bilateral: Secondary | ICD-10-CM | POA: Insufficient documentation

## 2018-09-26 DIAGNOSIS — Z79899 Other long term (current) drug therapy: Secondary | ICD-10-CM | POA: Insufficient documentation

## 2018-09-26 DIAGNOSIS — M542 Cervicalgia: Secondary | ICD-10-CM | POA: Insufficient documentation

## 2018-09-26 DIAGNOSIS — R52 Pain, unspecified: Secondary | ICD-10-CM

## 2018-09-26 DIAGNOSIS — R6884 Jaw pain: Secondary | ICD-10-CM | POA: Insufficient documentation

## 2018-09-26 LAB — PROTIME-INR
INR: 1 (ref 0.8–1.2)
Prothrombin Time: 13 seconds (ref 11.4–15.2)

## 2018-09-26 LAB — CBC
HCT: 36.6 % (ref 36.0–46.0)
Hemoglobin: 12.6 g/dL (ref 12.0–15.0)
MCH: 33 pg (ref 26.0–34.0)
MCHC: 34.4 g/dL (ref 30.0–36.0)
MCV: 95.8 fL (ref 80.0–100.0)
Platelets: 234 10*3/uL (ref 150–400)
RBC: 3.82 MIL/uL — ABNORMAL LOW (ref 3.87–5.11)
RDW: 12.2 % (ref 11.5–15.5)
WBC: 7.4 10*3/uL (ref 4.0–10.5)
nRBC: 0 % (ref 0.0–0.2)

## 2018-09-26 LAB — BASIC METABOLIC PANEL
Anion gap: 7 (ref 5–15)
BUN: 9 mg/dL (ref 6–20)
CO2: 23 mmol/L (ref 22–32)
Calcium: 8.7 mg/dL — ABNORMAL LOW (ref 8.9–10.3)
Chloride: 108 mmol/L (ref 98–111)
Creatinine, Ser: 0.48 mg/dL (ref 0.44–1.00)
GFR calc Af Amer: >60 mL/min (ref >60)
GFR calc non Af Amer: >60 mL/min (ref >60)
Glucose, Bld: 105 mg/dL — ABNORMAL HIGH (ref 70–99)
Potassium: 4 mmol/L (ref 3.5–5.1)
Sodium: 138 mmol/L (ref 135–145)

## 2018-09-26 LAB — LIPASE, BLOOD: Lipase: 30 U/L (ref 11–51)

## 2018-09-26 LAB — HEPATIC FUNCTION PANEL
ALT: 34 U/L (ref 0–44)
AST: 31 U/L (ref 15–41)
Albumin: 3.6 g/dL (ref 3.5–5.0)
Alkaline Phosphatase: 70 U/L (ref 38–126)
Bilirubin, Direct: <0.1 mg/dL (ref 0.0–0.2)
Total Bilirubin: 0.8 mg/dL (ref 0.3–1.2)
Total Protein: 6.7 g/dL (ref 6.5–8.1)

## 2018-09-26 LAB — LACTIC ACID, PLASMA
Lactic Acid, Venous: 0.9 mmol/L (ref 0.5–1.9)
Lactic Acid, Venous: 0.9 mmol/L (ref 0.5–1.9)

## 2018-09-26 LAB — I-STAT BETA HCG BLOOD, ED (MC, WL, AP ONLY): I-stat hCG, quantitative: <5 m[IU]/mL (ref <5)

## 2018-09-26 LAB — TROPONIN I (HIGH SENSITIVITY)
Troponin I (High Sensitivity): <2 ng/L (ref <18)
Troponin I (High Sensitivity): <2 ng/L (ref <18)

## 2018-09-26 LAB — SARS CORONAVIRUS 2 BY RT PCR (HOSPITAL ORDER, PERFORMED IN ~~LOC~~ HOSPITAL LAB): SARS Coronavirus 2: NEGATIVE

## 2018-09-26 MED ORDER — SODIUM CHLORIDE 0.9% FLUSH: 3.0000 mL | Freq: Once | INTRAVENOUS | Status: DC

## 2018-09-26 MED ORDER — IOHEXOL 350 MG/ML SOLN
100.0000 mL | Freq: Once | INTRAVENOUS | Status: AC | PRN
  Administered 2018-09-26: 100 mL via INTRAVENOUS

## 2018-09-26 MED ORDER — NITROGLYCERIN 0.4 MG SL SUBL
0.4000 mg | SUBLINGUAL_TABLET | SUBLINGUAL | Status: DC | PRN
  Administered 2018-09-26: 0.4 mg via SUBLINGUAL
  Filled 2018-09-26: qty 1

## 2018-09-26 MED ORDER — ASPIRIN 81 MG PO CHEW
324.0000 mg | CHEWABLE_TABLET | Freq: Once | ORAL | Status: AC
  Administered 2018-09-26: 324 mg via ORAL
  Filled 2018-09-26: qty 4

## 2018-09-26 NOTE — Progress Notes (Signed)
Left lower extremity venous duplex completed. Refer to "CV Proc" under chart review to view preliminary results.  09/26/2018 1:23 PM Maudry Mayhew, MHA, RVT, RDCS, RDMS

## 2018-09-26 NOTE — Discharge Instructions (Addendum)
Your work-up today was overall reassuring.  Your troponin was negative twice which is a cardiac enzyme.  Your ultrasound not show evidence of blood clot in your leg.  Your CT scan did not show evidence of blood clot in your lungs or dissection or problem with your aorta.  Your work-up was overall reassuring.  Is unclear the exact cause of your discomfort however based on your heart score and our chest pain algorithm, we feel you are safe for discharge home.  Please stay hydrated and rest.  Please follow-up with your primary doctor.  If any symptoms change or worsen, please return to the nearest emergency department.

## 2018-09-26 NOTE — ED Triage Notes (Signed)
Pt from home; c/o L sided CP, SOB, and dizziness that began this am; also c/o otalgia of the L ear, pain to L neck , L leg, L arm, and back x 1 week; denies injury; denies cough, fever, sick contacts

## 2018-09-26 NOTE — ED Notes (Signed)
Patient verbalizes understanding of discharge instructions. Opportunity for questioning and answers were provided. Armband removed by staff, pt discharged from ED home via POV.  

## 2018-09-26 NOTE — ED Provider Notes (Signed)
[facility]   CSN: [id number] Arrival date & time: 09/26/18  0745     History   Chief Complaint Chief Complaint  Patient presents with   Chest Pain   Shortness of Breath    HPI Christie Holder is a 48 y.o. female.     The history is provided by the patient and medical records. No language interpreter was used.  Chest Pain Pain location:  L chest Pain quality: aching, dull, pressure and sharp   Pain radiates to:  Neck, L shoulder, L jaw, L arm and upper back Pain severity:  Severe Onset quality:  Sudden Duration:  2 hours Timing:  Constant Progression:  Unchanged Chronicity:  New Context: breathing and at rest   Relieved by:  Nothing Worsened by:  Exertion and deep breathing Ineffective treatments:  None tried Associated symptoms: back pain, diaphoresis, dizziness, lower extremity edema, nausea, shortness of breath and vomiting   Associated symptoms: no abdominal pain, no cough, no fatigue, no fever, no headache, no numbness, no palpitations and no weakness   Risk factors: no coronary artery disease and no diabetes mellitus     Past Medical History:  Diagnosis Date   Gestational diabetes 10/10/2011    Patient Active Problem List   Diagnosis Date Noted   Cholelithiasis 10/02/2016   Symptomatic cholelithiasis 10/01/2016   Complete miscarriage 06/23/2013   History of macrosomia in infant in prior pregnancy, currently pregnant 12/02/2011   Obesity 07/04/2011    Past Surgical History:  Procedure Laterality Date   CESAREAN SECTION  12/19/2011   Procedure: CESAREAN SECTION;  Surgeon: Willodean Rosenthalarolyn Harraway-Smith, MD;  Location: [facility];  Service: Obstetrics;  Laterality: N/A;   CHOLECYSTECTOMY N/A 10/01/2016   Procedure: LAPAROSCOPIC CHOLECYSTECTOMY;  Surgeon: Violeta Gelinashompson, Burke, MD;  [facility];  Service: General;  Laterality: N/A;     OB History    Gravida  8   Para  7   Term  7   Preterm     AB  1   Living  7     SAB  1   TAB      Ectopic      Multiple      Live Births  7            Home Medications    Prior to Admission medications   Medication Sig Start Date End Date Taking? Authorizing Provider  ibuprofen (ADVIL,MOTRIN) 600 MG tablet Take 1 tablet (600 mg total) by mouth every 6 (six) hours as needed for cramping. 07/16/13   Katrinka BlazingSmith, [place], CNM  Multiple Vitamin (MULTIVITAMIN WITH MINERALS) TABS tablet Take 1 tablet by mouth daily.    [provider]  omega-3 acid ethyl esters (LOVAZA) 1 G capsule Take 1 g by mouth 2 (two) times daily.    [provider]  oxyCODONE (OXY IR/ROXICODONE) 5 MG immediate release tablet Take 1-2 tablets (5-10 mg total) by mouth every 4 (four) hours as needed for severe pain. 10/02/16   Hedda SladeGosai, [place], PA-C    Family History Family History  Problem Relation Age of Onset   Diabetes Maternal Grandmother     Social History Social History   Tobacco Use   Smoking status: Never Smoker   Smokeless tobacco: Never Used  Substance Use Topics   Alcohol use: No   Drug use: No     Allergies   Patient has no known allergies.   Review of Systems Review of Systems  Constitutional: Positive  for diaphoresis. Negative for chills, fatigue and fever.  HENT: Negative for congestion and rhinorrhea.   Eyes: Negative for visual disturbance.  Respiratory: Positive for shortness of breath. Negative for cough, chest tightness, wheezing and stridor.   Cardiovascular: Positive for chest pain and leg swelling (sujecgive). Negative for palpitations.  Gastrointestinal: Positive for nausea and vomiting. Negative for abdominal distention, abdominal pain, constipation and diarrhea.  Genitourinary: Negative for flank pain and frequency.  Musculoskeletal: Positive for back pain and neck pain. Negative for neck stiffness.  Skin: Negative for rash and wound.  Neurological: Positive for dizziness and light-headedness. Negative  for syncope, speech difficulty, weakness, numbness and headaches.  Psychiatric/Behavioral: Negative for agitation and confusion.  All other systems reviewed and are negative.    Physical Exam Updated Vital Signs There were no vitals taken for this visit.  Physical Exam Vitals signs and nursing note reviewed.  Constitutional:      General: She is not in acute distress.    Appearance: She is well-developed. She is not ill-appearing, toxic-appearing or diaphoretic.  HENT:     Head: Normocephalic and atraumatic.     Right Ear: External ear normal.     Left Ear: External ear normal.     Nose: Nose normal.     Mouth/Throat:     Pharynx: No oropharyngeal exudate.  Eyes:     Conjunctiva/sclera: Conjunctivae normal.     Pupils: Pupils are equal, round, and reactive to light.  Neck:     Musculoskeletal: Normal range of motion and neck supple.  Cardiovascular:     Rate and Rhythm: Normal rate.     Heart sounds: Normal heart sounds.  Pulmonary:     Effort: Pulmonary effort is normal. No tachypnea or respiratory distress.     Breath sounds: No stridor. No decreased breath sounds, wheezing, rhonchi or rales.  Chest:     Chest wall: Tenderness present.  Abdominal:     General: There is no distension.     Tenderness: There is no abdominal tenderness. There is no rebound.  Musculoskeletal:     Right lower leg: She exhibits no tenderness. No edema.     Left lower leg: She exhibits no tenderness. No edema.  Skin:    General: Skin is warm.     Capillary Refill: Capillary refill takes less than 2 seconds.     Findings: No erythema or rash.  Neurological:     General: No focal deficit present.     Mental Status: She is alert and oriented to person, place, and time.     Motor: No abnormal muscle tone.     Coordination: Coordination normal.     Deep Tendon Reflexes: Reflexes are normal and symmetric.  Psychiatric:        Mood and Affect: Mood normal.      ED Treatments / Results   Labs (all labs ordered are listed, but only abnormal results are displayed) Labs Reviewed  BASIC METABOLIC PANEL - Abnormal; Notable for the following components:      Result Value   Glucose, Bld 105 (*)    Calcium 8.7 (*)    All other components within normal limits  CBC - Abnormal; Notable for the following components:   RBC 3.82 (*)    All other components within normal limits  SARS CORONAVIRUS 2 (HOSPITAL ORDER, PERFORMED IN [facility])  TROPONIN I (HIGH SENSITIVITY)  TROPONIN I (HIGH SENSITIVITY)  LACTIC ACID, PLASMA  LACTIC ACID, PLASMA  PROTIME-INR  LIPASE,  BLOOD  HEPATIC FUNCTION PANEL  I-STAT BETA HCG BLOOD, ED ([facility], [facility], [facility] ONLY)    EKG EKG Interpretation  Date/Time:  Saturday September 26 2018 07:54:43 EDT Ventricular Rate:  61 PR Interval:    QRS Duration: 99 QT Interval:  437 QTC Calculation: 441 R Axis:   54 Text Interpretation:  Sinus arrhythmia No prior ECG for comparison.  No STEMI Confirmed by Theda Belfast ([id number]) on 09/26/2018 8:58:03 AM   Radiology Dg Chest Port 1 View  Result Date: 09/26/2018 CLINICAL DATA:  Shortness of breath EXAM: PORTABLE CHEST 1 VIEW COMPARISON:  None. FINDINGS: Mild cardiomegaly without CHF or focal pneumonia. Negative for effusion or pneumothorax. Trachea midline. IMPRESSION: Mild cardiomegaly without acute process Electronically Signed   By: Judie Petit.  Shick M.D.   On: 09/26/2018 09:48   Ct Angio Chest/abd/pel For Dissection W And/or Wo Contrast  Result Date: 09/26/2018 CLINICAL DATA:  Left-sided chest pain radiating to the back with shortness of breath. EXAM: CT ANGIOGRAPHY CHEST, ABDOMEN AND PELVIS TECHNIQUE: Multidetector CT imaging through the chest, abdomen and pelvis was performed using the standard protocol during bolus administration of intravenous contrast. Multiplanar reconstructed images and MIPs were obtained and reviewed to evaluate the vascular anatomy. CONTRAST:  OMNIPAQUE IOHEXOL 350 MG/ML SOLN COMPARISON:   Chest x-ray from same day. CT abdomen pelvis dated January 11, 2009. FINDINGS: CTA CHEST FINDINGS Cardiovascular: Preferential opacification of the thoracic aorta. No evidence of thoracic aortic aneurysm or dissection. Normal heart size. No pericardial effusion. No central pulmonary embolism. Mediastinum/Nodes: No enlarged mediastinal, hilar, or axillary lymph nodes. Thyroid gland, trachea, and esophagus demonstrate no significant findings. Lungs/Pleura: No focal consolidation, pleural effusion, or pneumothorax. No suspicious pulmonary nodule. Musculoskeletal: No chest wall abnormality. No acute or significant osseous findings. Review of the MIP images confirms the above findings. CTA ABDOMEN AND PELVIS FINDINGS VASCULAR Aorta: Normal caliber aorta without aneurysm, dissection, vasculitis or significant stenosis. Celiac: Patent without evidence of aneurysm, dissection, vasculitis or significant stenosis. SMA: Patent without evidence of aneurysm, dissection, vasculitis or significant stenosis. Renals: Both renal arteries are patent without evidence of aneurysm, dissection, vasculitis, fibromuscular dysplasia or significant stenosis. IMA: Patent without evidence of aneurysm, dissection, vasculitis or significant stenosis. Inflow: Patent without evidence of aneurysm, dissection, vasculitis or significant stenosis. Veins: No obvious venous abnormality within the limitations of this arterial phase study. Review of the MIP images confirms the above findings. NON-VASCULAR Hepatobiliary: No focal liver abnormality is seen. Status post cholecystectomy. No biliary dilatation. Pancreas: Unremarkable. No pancreatic ductal dilatation or surrounding inflammatory changes. Spleen: Normal in size without focal abnormality. Adrenals/Urinary Tract: Adrenal glands are unremarkable. Kidneys are normal, without renal calculi, focal lesion, or hydronephrosis. Bladder is unremarkable. Stomach/Bowel: Stomach is within normal limits.  Appendix appears normal. No evidence of bowel wall thickening, distention, or inflammatory changes. Lymphatic: No enlarged abdominal or pelvic lymph nodes. Reproductive: Uterus and bilateral adnexa are unremarkable. Other: Small to moderate fat containing infraumbilical hernia. No free fluid or pneumoperitoneum. Musculoskeletal: No acute or significant osseous findings. Review of the MIP images confirms the above findings. IMPRESSION: Vascular: 1. No evidence of acute aortic syndrome. No thoracoabdominal aortic aneurysm. Chest: 1.  No acute intrathoracic process. Abdomen and pelvis: 1.  No acute intra-abdominal process. Electronically Signed   By: Obie Dredge M.D.   On: 09/26/2018 11:19   Vas [place] Lower Extremity Venous (dvt) (only [facility])  Result Date: 09/26/2018  Lower Venous Study Indications: Pain.  Comparison Study: No prior study. Performing Technologist: Gertie Fey  MHA, RDMS, RVT, RDCS  Examination Guidelines: A complete evaluation includes B-mode imaging, spectral Doppler, color Doppler, and power Doppler as needed of all accessible portions of each vessel. Bilateral testing is considered an integral part of a complete examination. Limited examinations for reoccurring indications may be performed as noted.  +-----+---------------+---------+-----------+----------+-------+  RIGHT Compressibility Phasicity Spontaneity Properties Summary  +-----+---------------+---------+-----------+----------+-------+  CFV   Full            Yes       Yes                             +-----+---------------+---------+-----------+----------+-------+   +---------+---------------+---------+-----------+----------+-------+  LEFT      Compressibility Phasicity Spontaneity Properties Summary  +---------+---------------+---------+-----------+----------+-------+  CFV       Full            Yes       Yes                             +---------+---------------+---------+-----------+----------+-------+  SFJ       Full                                                       +---------+---------------+---------+-----------+----------+-------+  FV Prox   Full                                                      +---------+---------------+---------+-----------+----------+-------+  FV Mid    Full                                                      +---------+---------------+---------+-----------+----------+-------+  FV Distal Full                                                      +---------+---------------+---------+-----------+----------+-------+  PFV       Full                                                      +---------+---------------+---------+-----------+----------+-------+  POP       Full            Yes       Yes                             +---------+---------------+---------+-----------+----------+-------+  PTV       Full                                                      +---------+---------------+---------+-----------+----------+-------+  PERO      Full                                                      +---------+---------------+---------+-----------+----------+-------+     Summary: Right: No evidence of common femoral vein obstruction. Left: There is no evidence of deep vein thrombosis in the lower extremity. No cystic structure found in the popliteal fossa.  *See table(s) above for measurements and observations.    Preliminary     Procedures Procedures (including critical care time)  Medications Ordered in ED Medications  nitroGLYCERIN (NITROSTAT) SL tablet 0.4 mg (0.4 mg Sublingual Given 09/26/18 0913)  aspirin chewable tablet 324 mg (324 mg Oral Given 09/26/18 0912)  iohexol (OMNIPAQUE) 350 MG/ML injection 100 mL (100 mLs Intravenous Contrast Given 09/26/18 1041)     Initial Impression / Assessment and Plan / ED Course  I have reviewed the triage vital signs and the nursing notes.  Pertinent labs & imaging results that were available during my care of the patient were reviewed by me and considered in my medical  decision making (see chart for details).        Dimond Crotty is a 48 y.o. female with a past medical history significant for prior cholecystectomy who presents with chest pain, left arm pain, left jaw pain/neck pain, left leg pain/left leg swelling, diaphoresis, nausea, shortness of breath.  Patient reports that her symptoms began with leg pain and swelling 2 weeks ago.  She reports is been persistent.  She denies a history of DVT or PE or any family history of this.  She denies a family history of cardiac disease in her immediate family.  She denies smoking.  She reports that for the last week she also has had left arm and left jaw pain and soreness.  She denies numbness or tingling but reports today she woke up with shortness of breath and severe pain.  She reports the pain was a 9 out of 10 in severity going straight through to her back as well as worsening in the arm and jaw.  She reported nausea and vomiting as well as diaphoresis.  She reports was very short of breath.  She reports it was pleuritic and somewhat exertional.  She has never had this before and denies any trauma.  She reports that she was feeling lightheaded and slightly dizzy.  She denies other complaints on arrival including no sniffing abdominal pain, urinary symptoms or other GI symptoms.  On exam, left chest is slightly tender palpation but not completely reproducible.  No murmur.  Lungs clear and abdomen was nontender.  Patient had palpable DP pulses bilaterally.  Radial pulses are symmetric and palpable.  Patient had no tenderness in her neck or back.  Patient's blood pressure was normotensive and her heart rate was slightly bradycardic.  She otherwise did not appear in acute distress despite having a chest pain around 8 9 out of 10.     EKG shows sinus rhythm.  No evidence of STEMI.  No findings consistent with pulmonary embolism initially.  Clinical I am concerned out several concerning etiologies of her symptoms.   With the leg pain and leg swelling preceding all further symptoms, patient will have a DVT ultrasound to rule out DVT.  With the pain in both the left leg, left arm,  chest straight through to her back, and left neck/shoulder, and acute aortic dissection is considered.  Patient will have an aortic CT as well as chest x-ray.  I suspect a large pulmonary embolism would be seen on the CT scan however we need to assess her aorta given the extremity symptoms aside from just a PE study of the chest.  If dissection is seen to be going superiorly, will consider either head or neck imaging further.  Patient will have screen laboratory testing including troponin and other labs.  She will be given aspirin as well as nitroglycerin to try to help with the pain.  Heart score calculated as a 4.  Anticipate reassessment after work-up.  Work-up was overall reassuring.  Troponin negative x2.  Lactic acid reassuring.  She is not pregnant.  Coronavirus test negative.  CT dissection study showed no normality specifically no dissection and no PE centrally.  Patient reports feeling much better and thinks it might just be stress and anxiety.  DVT ultrasound was negative.  Given her reassuring work-up and the chest pain algorithm showing that she is appropriate for observation admission versus close outpatient follow-up, patient elected for discharge with PCP follow-up.  She understands return precautions for any new or worsening symptoms.  She had no other worsens or concerns and was feeling better.  Patient discharged in good condition.    Final Clinical Impressions(s) / ED Diagnoses   Final diagnoses:  Precordial pain    ED Discharge Orders    None     Clinical Impression: 1. Precordial pain   2. SOB (shortness of breath)     Disposition: Discharge  Condition: Good  I have discussed the results, Dx and Tx plan with the pt(& family if present). He/she/they expressed understanding and agree(s) with the plan.  Discharge instructions discussed at great length. Strict return precautions discussed and pt &/or family have verbalized understanding of the instructions. No further questions at time of discharge.    New Prescriptions   No medications on file    [facility] [address] [id number] [place] [id number] [phone]    [facility] [address] [place] [phone] Schedule an appointment as soon as possible for a visit       Eleah Lahaie, Canary Brimhristopher J, MD 09/26/18 1355

## 2018-09-26 NOTE — ED Notes (Signed)
Patient transported to CT 

## 2019-02-23 ENCOUNTER — Other Ambulatory Visit: Payer: Self-pay | Admitting: Cardiology

## 2019-02-23 DIAGNOSIS — Z20822 Contact with and (suspected) exposure to covid-19: Secondary | ICD-10-CM

## 2019-02-25 LAB — NOVEL CORONAVIRUS, NAA: SARS-CoV-2, NAA: DETECTED — AB

## 2019-02-26 ENCOUNTER — Other Ambulatory Visit: Payer: Self-pay | Admitting: Nurse Practitioner

## 2019-02-26 DIAGNOSIS — E6609 Other obesity due to excess calories: Secondary | ICD-10-CM

## 2019-02-26 DIAGNOSIS — U071 COVID-19: Secondary | ICD-10-CM

## 2019-02-26 NOTE — Progress Notes (Signed)
  I connected by phone with Christie Holder on 02/26/2019 at 1:11 PM to discuss the potential use of an new treatment for mild to moderate COVID-19 viral infection in non-hospitalized patients.  This patient is a 48 y.o. female that meets the FDA criteria for Emergency Use Authorization of bamlanivimab:  Has a (+) direct SARS-CoV-2 viral test result  Has mild or moderate COVID-19   Is ? 46 years of age and weighs ? 40 kg  Is NOT hospitalized due to COVID-19  Is NOT requiring oxygen therapy or requiring an increase in baseline oxygen flow rate due to COVID-19  Is within 10 days of symptom onset  Has at least one of the high risk factor(s) for progression to severe COVID-19 and/or hospitalization as defined in EUA.  Specific high risk criteria : BMI >/= 35 Patient is being managed for the following:  Patient Active Problem List   Diagnosis Date Noted  . Cholelithiasis 10/02/2016  . Symptomatic cholelithiasis 10/01/2016  . Complete miscarriage 06/23/2013  . History of macrosomia in infant in prior pregnancy, currently pregnant 12/02/2011  . Obesity 07/04/2011    I have spoken and communicated the following to the patient or parent/caregiver:  1. FDA has authorized the emergency use of bamlanivimab for the treatment of mild to moderate COVID-19 in adults and pediatric patients with positive results of direct SARS-CoV-2 viral testing who are 45 years of age and older weighing at least 40 kg, and who are at high risk for progressing to severe COVID-19 and/or hospitalization.  2. The significant known and potential risks and benefits of bamlanivimab, and the extent to which such potential risks and benefits are unknown.  3. Information on available alternative treatments and the risks and benefits of those alternatives, including clinical trials.  4. Patients treated with bamlanivimab should continue to self-isolate and use infection control measures (e.g., wear mask, isolate,  social distance, avoid sharing personal items, clean and disinfect "high touch" surfaces, and frequent handwashing) according to CDC guidelines.   5. The patient or parent/caregiver has the option to accept or refuse bamlanivimab.  After reviewing this information with the patient, The patient agreed to proceed with receiving the infusion of bamlanivimab and will be provided a copy of the Fact sheet prior to receiving the infusion.  Fenton Foy 02/26/2019 1:11 PM

## 2019-03-01 ENCOUNTER — Ambulatory Visit (HOSPITAL_COMMUNITY)
Admission: RE | Admit: 2019-03-01 | Discharge: 2019-03-01 | Disposition: A | Discharge status: Home / Self Care | Payer: HRSA Program | Source: Ambulatory Visit | Attending: Critical Care Medicine | Admitting: Critical Care Medicine

## 2019-03-01 DIAGNOSIS — U071 COVID-19: Secondary | ICD-10-CM | POA: Insufficient documentation

## 2019-03-01 DIAGNOSIS — E6609 Other obesity due to excess calories: Secondary | ICD-10-CM | POA: Diagnosis present

## 2019-03-01 DIAGNOSIS — Z6837 Body mass index (BMI) 37.0-37.9, adult: Secondary | ICD-10-CM | POA: Insufficient documentation

## 2019-03-01 MED ORDER — ALBUTEROL SULFATE HFA 108 (90 BASE) MCG/ACT IN AERS: 2.0000 | INHALATION_SPRAY | Freq: Once | RESPIRATORY_TRACT | Status: DC | PRN

## 2019-03-01 MED ORDER — DIPHENHYDRAMINE HCL 50 MG/ML IJ SOLN: 50.0000 mg | Freq: Once | INTRAMUSCULAR | Status: DC | PRN

## 2019-03-01 MED ORDER — SODIUM CHLORIDE 0.9 % IV SOLN
700.0000 mg | Freq: Once | INTRAVENOUS | Status: AC
  Administered 2019-03-01: 12:00:00 700 mg via INTRAVENOUS
  Filled 2019-03-01: qty 20

## 2019-03-01 MED ORDER — SODIUM CHLORIDE 0.9 % IV SOLN: INTRAVENOUS | Status: DC | PRN

## 2019-03-01 MED ORDER — FAMOTIDINE IN NACL 20-0.9 MG/50ML-% IV SOLN: 20.0000 mg | Freq: Once | INTRAVENOUS | Status: DC | PRN

## 2019-03-01 MED ORDER — METHYLPREDNISOLONE SODIUM SUCC 125 MG IJ SOLR: 125.0000 mg | Freq: Once | INTRAMUSCULAR | Status: DC | PRN

## 2019-03-01 MED ORDER — EPINEPHRINE 0.3 MG/0.3ML IJ SOAJ: 0.3000 mg | Freq: Once | INTRAMUSCULAR | Status: DC | PRN

## 2019-03-01 NOTE — Progress Notes (Signed)
Patient ID: Christie Holder, female   DOB: [birth year], 48 y.o.   MRN: [id number]  Diagnosis: [id number]  Physician:  Procedure: [facility] Med: bamlanivimab infusion - Provided patient with bamlanimivab fact sheet for patients, parents and caregivers prior to infusion.  Complications: No immediate complications noted.  Discharge: Discharged home   Jerilyn, Gillaspie 03/01/2019

## 2020-08-20 ENCOUNTER — Emergency Department (HOSPITAL_COMMUNITY): Payer: Medicaid Other

## 2020-08-20 ENCOUNTER — Encounter (HOSPITAL_COMMUNITY): Payer: Self-pay

## 2020-08-20 ENCOUNTER — Other Ambulatory Visit: Payer: Self-pay

## 2020-08-20 ENCOUNTER — Inpatient Hospital Stay (HOSPITAL_COMMUNITY)
Admission: EM | Admit: 2020-08-20 | Discharge: 2020-08-23 | DRG: 872 | Disposition: A | Discharge status: Home / Self Care | Payer: Medicaid Other | Attending: Internal Medicine | Admitting: Internal Medicine

## 2020-08-20 DIAGNOSIS — R739 Hyperglycemia, unspecified: Secondary | ICD-10-CM | POA: Diagnosis present

## 2020-08-20 DIAGNOSIS — R1084 Generalized abdominal pain: Secondary | ICD-10-CM

## 2020-08-20 DIAGNOSIS — I700 Atherosclerosis of aorta: Secondary | ICD-10-CM | POA: Diagnosis present

## 2020-08-20 DIAGNOSIS — N10 Acute tubulo-interstitial nephritis: Secondary | ICD-10-CM | POA: Diagnosis present

## 2020-08-20 DIAGNOSIS — E871 Hypo-osmolality and hyponatremia: Secondary | ICD-10-CM | POA: Diagnosis present

## 2020-08-20 DIAGNOSIS — K439 Ventral hernia without obstruction or gangrene: Secondary | ICD-10-CM | POA: Diagnosis present

## 2020-08-20 DIAGNOSIS — A419 Sepsis, unspecified organism: Secondary | ICD-10-CM | POA: Diagnosis present

## 2020-08-20 DIAGNOSIS — K760 Fatty (change of) liver, not elsewhere classified: Secondary | ICD-10-CM | POA: Diagnosis present

## 2020-08-20 DIAGNOSIS — N390 Urinary tract infection, site not specified: Secondary | ICD-10-CM | POA: Diagnosis present

## 2020-08-20 DIAGNOSIS — Z9049 Acquired absence of other specified parts of digestive tract: Secondary | ICD-10-CM | POA: Diagnosis not present

## 2020-08-20 DIAGNOSIS — D638 Anemia in other chronic diseases classified elsewhere: Secondary | ICD-10-CM | POA: Diagnosis present

## 2020-08-20 DIAGNOSIS — R319 Hematuria, unspecified: Secondary | ICD-10-CM | POA: Diagnosis present

## 2020-08-20 DIAGNOSIS — Z20822 Contact with and (suspected) exposure to covid-19: Secondary | ICD-10-CM | POA: Diagnosis present

## 2020-08-20 DIAGNOSIS — Z8632 Personal history of gestational diabetes: Secondary | ICD-10-CM

## 2020-08-20 DIAGNOSIS — N12 Tubulo-interstitial nephritis, not specified as acute or chronic: Secondary | ICD-10-CM

## 2020-08-20 DIAGNOSIS — R300 Dysuria: Secondary | ICD-10-CM

## 2020-08-20 DIAGNOSIS — R509 Fever, unspecified: Secondary | ICD-10-CM

## 2020-08-20 DIAGNOSIS — R110 Nausea: Secondary | ICD-10-CM

## 2020-08-20 DIAGNOSIS — Z833 Family history of diabetes mellitus: Secondary | ICD-10-CM | POA: Diagnosis not present

## 2020-08-20 LAB — COMPREHENSIVE METABOLIC PANEL
ALT: 22 U/L (ref 0–44)
AST: 22 U/L (ref 15–41)
Albumin: 3.7 g/dL (ref 3.5–5.0)
Alkaline Phosphatase: 74 U/L (ref 38–126)
Anion gap: 10 (ref 5–15)
BUN: 7 mg/dL (ref 6–20)
CO2: 24 mmol/L (ref 22–32)
Calcium: 8.8 mg/dL — ABNORMAL LOW (ref 8.9–10.3)
Chloride: 99 mmol/L (ref 98–111)
Creatinine, Ser: 0.56 mg/dL (ref 0.44–1.00)
GFR, Estimated: >60 mL/min (ref >60)
Glucose, Bld: 117 mg/dL — ABNORMAL HIGH (ref 70–99)
Potassium: 3.9 mmol/L (ref 3.5–5.1)
Sodium: 133 mmol/L — ABNORMAL LOW (ref 135–145)
Total Bilirubin: 1.1 mg/dL (ref 0.3–1.2)
Total Protein: 7.4 g/dL (ref 6.5–8.1)

## 2020-08-20 LAB — CBC WITH DIFFERENTIAL/PLATELET
Abs Immature Granulocytes: 0.05 10*3/uL (ref 0.00–0.07)
Basophils Absolute: 0 10*3/uL (ref 0.0–0.1)
Basophils Relative: 0 %
Eosinophils Absolute: 0 10*3/uL (ref 0.0–0.5)
Eosinophils Relative: 0 %
HCT: 38.1 % (ref 36.0–46.0)
Hemoglobin: 13 g/dL (ref 12.0–15.0)
Immature Granulocytes: 0 %
Lymphocytes Relative: 13 %
Lymphs Abs: 1.6 10*3/uL (ref 0.7–4.0)
MCH: 32.2 pg (ref 26.0–34.0)
MCHC: 34.1 g/dL (ref 30.0–36.0)
MCV: 94.3 fL (ref 80.0–100.0)
Monocytes Absolute: 0.9 10*3/uL (ref 0.1–1.0)
Monocytes Relative: 7 %
Neutro Abs: 10.1 10*3/uL — ABNORMAL HIGH (ref 1.7–7.7)
Neutrophils Relative %: 80 %
Platelets: 225 10*3/uL (ref 150–400)
RBC: 4.04 MIL/uL (ref 3.87–5.11)
RDW: 12.3 % (ref 11.5–15.5)
WBC: 12.6 10*3/uL — ABNORMAL HIGH (ref 4.0–10.5)
nRBC: 0 % (ref 0.0–0.2)

## 2020-08-20 LAB — I-STAT BETA HCG BLOOD, ED (MC, WL, AP ONLY): I-stat hCG, quantitative: <5 m[IU]/mL (ref <5)

## 2020-08-20 LAB — LACTIC ACID, PLASMA
Lactic Acid, Venous: 1.4 mmol/L (ref 0.5–1.9)
Lactic Acid, Venous: 1.1 mmol/L (ref 0.5–1.9)

## 2020-08-20 LAB — URINALYSIS, ROUTINE W REFLEX MICROSCOPIC
Bilirubin Urine: NEGATIVE
Glucose, UA: NEGATIVE mg/dL
Hgb urine dipstick: NEGATIVE
Ketones, ur: NEGATIVE mg/dL
Nitrite: NEGATIVE
Protein, ur: NEGATIVE mg/dL
Specific Gravity, Urine: 1.01 (ref 1.005–1.030)
pH: 6 (ref 5.0–8.0)

## 2020-08-20 LAB — RESP PANEL BY RT-PCR (FLU A&B, COVID) ARPGX2
Influenza A by PCR: NEGATIVE
Influenza B by PCR: NEGATIVE
SARS Coronavirus 2 by RT PCR: NEGATIVE

## 2020-08-20 LAB — LIPASE, BLOOD: Lipase: 29 U/L (ref 11–51)

## 2020-08-20 MED ORDER — ACETAMINOPHEN 650 MG RE SUPP: 650.0000 mg | Freq: Four times a day (QID) | RECTAL | Status: DC | PRN

## 2020-08-20 MED ORDER — SODIUM CHLORIDE 0.9 % IV SOLN
1.0000 g | Freq: Once | INTRAVENOUS | Status: AC
  Administered 2020-08-20: 1 g via INTRAVENOUS
  Filled 2020-08-20: qty 10

## 2020-08-20 MED ORDER — LACTATED RINGERS IV SOLN: INTRAVENOUS | Status: AC

## 2020-08-20 MED ORDER — ONDANSETRON HCL 4 MG PO TABS
4.0000 mg | ORAL_TABLET | Freq: Four times a day (QID) | ORAL | Status: DC | PRN
  Administered 2020-08-22: 4 mg via ORAL
  Filled 2020-08-20: qty 1

## 2020-08-20 MED ORDER — ONDANSETRON HCL 4 MG/2ML IJ SOLN
4.0000 mg | Freq: Once | INTRAMUSCULAR | Status: AC
  Administered 2020-08-20: 4 mg via INTRAVENOUS
  Filled 2020-08-20: qty 2

## 2020-08-20 MED ORDER — ACETAMINOPHEN 325 MG PO TABS
650.0000 mg | ORAL_TABLET | Freq: Four times a day (QID) | ORAL | Status: DC | PRN
  Administered 2020-08-20 – 2020-08-22 (×8): 650 mg via ORAL
  Filled 2020-08-20 (×9): qty 2

## 2020-08-20 MED ORDER — SODIUM CHLORIDE 0.9 % IV SOLN
1.0000 g | INTRAVENOUS | Status: DC
  Administered 2020-08-21 – 2020-08-22 (×2): 1 g via INTRAVENOUS
  Filled 2020-08-20: qty 10
  Filled 2020-08-20 (×2): qty 1

## 2020-08-20 MED ORDER — ENOXAPARIN SODIUM 40 MG/0.4ML IJ SOSY
40.0000 mg | PREFILLED_SYRINGE | INTRAMUSCULAR | Status: DC
  Administered 2020-08-20 – 2020-08-22 (×3): 40 mg via SUBCUTANEOUS
  Filled 2020-08-20 (×3): qty 0.4

## 2020-08-20 MED ORDER — ONDANSETRON HCL 4 MG/2ML IJ SOLN
4.0000 mg | Freq: Four times a day (QID) | INTRAMUSCULAR | Status: DC | PRN
  Administered 2020-08-21 (×2): 4 mg via INTRAVENOUS
  Filled 2020-08-20 (×3): qty 2

## 2020-08-20 MED ORDER — HYDROMORPHONE HCL 1 MG/ML IJ SOLN
0.5000 mg | INTRAMUSCULAR | Status: DC | PRN
Start: 2020-08-20 — End: 2020-08-23
  Administered 2020-08-20 – 2020-08-23 (×6): 0.5 mg via INTRAVENOUS
  Filled 2020-08-20 (×6): qty 1

## 2020-08-20 MED ORDER — SODIUM CHLORIDE 0.9 % IV BOLUS
1000.0000 mL | Freq: Once | INTRAVENOUS | Status: AC
  Administered 2020-08-20: 1000 mL via INTRAVENOUS

## 2020-08-20 MED ORDER — MORPHINE SULFATE (PF) 4 MG/ML IV SOLN
4.0000 mg | Freq: Once | INTRAVENOUS | Status: AC
Start: 2020-08-20 — End: 2020-08-20
  Administered 2020-08-20: 4 mg via INTRAVENOUS
  Filled 2020-08-20: qty 1

## 2020-08-20 NOTE — ED Notes (Signed)
Ambukated to BR for Urine sample. States she feels better.

## 2020-08-20 NOTE — ED Provider Notes (Signed)
[facility] EMERGENCY DEPARTMENT Provider Note   CSN: [id number] Arrival date & time: 08/20/20  1248     History No chief complaint on file.   Christie Holder is a 50 y.o. female.  The history is provided by the patient and medical records. No language interpreter was used.  Abdominal Pain Pain location:  L flank, R flank, LUQ, LLQ, RUQ and RLQ Pain quality: aching   Pain radiates to:  R flank and L flank Pain severity:  Moderate Onset quality:  Gradual Duration:  6 days Timing:  Constant Progression:  Worsening Context: not trauma   Relieved by:  Nothing Worsened by:  Nothing Ineffective treatments: antibiotics. Associated symptoms: chills, dysuria, fatigue, fever and nausea   Associated symptoms: no chest pain, no constipation, no cough, no diarrhea, no shortness of breath and no vomiting        Past Medical History:  Diagnosis Date  . Gestational diabetes 10/10/2011    Patient Active Problem List   Diagnosis Date Noted  . Cholelithiasis 10/02/2016  . Symptomatic cholelithiasis 10/01/2016  . Complete miscarriage 06/23/2013  . History of macrosomia in infant in prior pregnancy, currently pregnant 12/02/2011  . Obesity 07/04/2011    Past Surgical History:  Procedure Laterality Date  . CESAREAN SECTION  12/19/2011   Procedure: CESAREAN SECTION;  Surgeon: Willodean Rosenthal, MD;  Location: [facility] ORS;  Service: Obstetrics;  Laterality: N/A;  . CHOLECYSTECTOMY N/A 10/01/2016   Procedure: LAPAROSCOPIC CHOLECYSTECTOMY;  Surgeon: Violeta Gelinas, MD;  Location: [facility] OR;  Service: General;  Laterality: N/A;     OB History    Gravida  8   Para  7   Term  7   Preterm      AB  1   Living  7     SAB  1   IAB      Ectopic      Multiple      Live Births  7           Family History  Problem Relation Age of Onset  . Diabetes Maternal Grandmother     Social History   Tobacco Use  . Smoking status: Never Smoker  . Smokeless  tobacco: Never Used  Vaping Use  . Vaping Use: Never used  Substance Use Topics  . Alcohol use: No  . Drug use: No    Home Medications Prior to Admission medications   Medication Sig Start Date End Date Taking? Authorizing Provider  ibuprofen (ADVIL,MOTRIN) 600 MG tablet Take 1 tablet (600 mg total) by mouth every 6 (six) hours as needed for cramping. Patient not taking: Reported on 09/26/2018 07/16/13   Katrinka Blazing, [place], CNM  oxyCODONE (OXY IR/ROXICODONE) 5 MG immediate release tablet Take 1-2 tablets (5-10 mg total) by mouth every 4 (four) hours as needed for severe pain. Patient not taking: Reported on 09/26/2018 10/02/16   Hedda Slade, PA-C    Allergies    Patient has no known allergies.  Review of Systems   Review of Systems  Constitutional: Positive for chills, fatigue and fever.  HENT: Positive for congestion.   Eyes: Positive for photophobia. Negative for visual disturbance.  Respiratory: Negative for cough, chest tightness, shortness of breath and wheezing.   Cardiovascular: Negative for chest pain, palpitations and leg swelling.  Gastrointestinal: Positive for abdominal pain and nausea. Negative for abdominal distention, constipation, diarrhea and vomiting.  Genitourinary: Positive for dysuria, flank pain and frequency.  Musculoskeletal: Positive for back pain and myalgias. Negative  for neck pain and neck stiffness.  Skin: Negative for rash.  Neurological: Positive for headaches. Negative for weakness and numbness.  Psychiatric/Behavioral: Negative for agitation and confusion.  All other systems reviewed and are negative.   Physical Exam Updated Vital Signs BP 109/60   Pulse (!) 108   Temp (!) 102.9 F (39.4 C) (Oral)   SpO2 97%   Physical Exam Vitals and nursing note reviewed.  Constitutional:      General: She is not in acute distress.    Appearance: She is well-developed. She is not ill-appearing, toxic-appearing or diaphoretic.  HENT:     Head: Normocephalic  and atraumatic.     Mouth/Throat:     Mouth: Mucous membranes are dry.     Pharynx: No oropharyngeal exudate or posterior oropharyngeal erythema.  Eyes:     Conjunctiva/sclera: Conjunctivae normal.     Pupils: Pupils are equal, round, and reactive to light.  Cardiovascular:     Rate and Rhythm: Regular rhythm. Tachycardia present.     Heart sounds: No murmur heard.   Pulmonary:     Effort: Pulmonary effort is normal. No respiratory distress.     Breath sounds: Normal breath sounds. No wheezing, rhonchi or rales.  Chest:     Chest wall: No tenderness.  Abdominal:     General: Abdomen is flat.     Palpations: Abdomen is soft.     Tenderness: There is abdominal tenderness. There is right CVA tenderness and left CVA tenderness. There is no guarding or rebound.  Musculoskeletal:        General: No tenderness.     Cervical back: Neck supple. No rigidity or tenderness.     Right lower leg: No edema.     Left lower leg: No edema.  Skin:    General: Skin is warm and dry.     Capillary Refill: Capillary refill takes less than 2 seconds.     Findings: No erythema.  Neurological:     General: No focal deficit present.     Mental Status: She is alert.  Psychiatric:        Mood and Affect: Mood normal.     ED Results / Procedures / Treatments   Labs (all labs ordered are listed, but only abnormal results are displayed) Labs Reviewed  COMPREHENSIVE METABOLIC PANEL - Abnormal; Notable for the following components:      Result Value   Sodium 133 (*)    Glucose, Bld 117 (*)    Calcium 8.8 (*)    All other components within normal limits  CBC WITH DIFFERENTIAL/PLATELET - Abnormal; Notable for the following components:   WBC 12.6 (*)    Neutro Abs 10.1 (*)    All other components within normal limits  URINE CULTURE  RESP PANEL BY RT-PCR (FLU A&B, COVID) ARPGX2  LACTIC ACID, PLASMA  LACTIC ACID, PLASMA  URINALYSIS, ROUTINE W REFLEX MICROSCOPIC  LIPASE, BLOOD  I-STAT BETA HCG  BLOOD, ED ([facility], [facility], [facility] ONLY)    EKG None  Radiology DG Chest Portable 1 View  Result Date: 08/20/2020 CLINICAL DATA:  Encounter for: fever, tachycardia, and borderline hypoxia EXAM: PORTABLE CHEST 1 VIEW COMPARISON:  09/26/2018 FINDINGS: The heart size and mediastinal contours are within normal limits. Both lungs are clear. No pleural effusion or pneumothorax. The visualized skeletal structures are unremarkable. IMPRESSION: No active disease. Electronically Signed   By: Amie Portland M.D.   On: 08/20/2020 14:50    Procedures Procedures    Medications Ordered  in ED Medications  sodium chloride 0.9 % bolus 1,000 mL (0 mLs Intravenous Stopped 08/20/20 1602)  ondansetron (ZOFRAN) injection 4 mg (4 mg Intravenous Given 08/20/20 1503)  morphine 4 MG/ML injection 4 mg (4 mg Intravenous Given 08/20/20 1503)    ED Course  I have reviewed the triage vital signs and the nursing notes.  Pertinent labs & imaging results that were available during my care of the patient were reviewed by me and considered in my medical decision making (see chart for details).    MDM Rules/Calculators/A&P                          Christie Holder is a 50 y.o. female 6 days of fevers, chills, abdominal pain, bilateral flank pain, dysuria, fatigue, malaise, and myalgias.  Patient reports that she initially was told that she had urinary tract infection and was started on Bactrim and several days later was switched to [facility] which she is on day 2 of without significantly.  She is noting fevers, chills, and is noting more pain in her bilateral flanks and back.  She is also having abdominal pain diffusely.  She reports some nausea but no acute vomiting.  She denies any chest pain or shortness of breath but her oxygen saturations were in the low 90s on arrival.  She has no rashes and did not report any tick exposures.  She is having some headaches but more associated with diffuse myalgias and aches.  No neck pain or neck  stiffness.  No other complaints on arrival.  On exam, lungs are clear and chest is nontender.  Abdomen is diffusely tender including the bilateral flanks and CVA areas.  Drip mucous membranes were dry on exam suspect some dehydration.  No nuchal rigidity.  Normal neck range of motion.  No focal neurologic deficits.  Patient is febrile and tachycardic on arrival.  Clinically I am concerned about urinary tract infection versus pyelonephritis versus even infected stone given the flank pains.  We will get a noncontrasted CT scan to look for stone versus other intra-abdominal pathology such as appendicitis or diverticulitis.  I will get a chest x-ray due to the borderline hypoxia.  We will give her fluids, nausea medicine, and pain medicine.  We will check other labs as well.  Anticipate reassessment of the patient to determine if she will need admission or not depending on work-up results.  If she still feels bad, she has technically failed to outpatient antibiotics for urinary tract infection if she has pyelonephritis however if she is feeling much better, she may be a candidate for changing to a different antibiotic if that is all that is seen.  Care transferred to oncoming team awaiting for reassessment and completion of work-up.   Final Clinical Impression(s) / ED Diagnoses Final diagnoses:  Fever, unspecified fever cause  Generalized abdominal pain  Nausea  Dysuria     Clinical Impression: 1. Fever, unspecified fever cause   2. Generalized abdominal pain   3. Nausea   4. Dysuria     Disposition: Care transferred to oncoming team awaiting for reassessment and completion of work-up.  This note was prepared with assistance of Conservation officer, historic buildings. Occasional wrong-word or sound-a-like substitutions may have occurred due to the inherent limitations of voice recognition software.      Tandy Grawe, Canary Brim, MD 08/20/20 [phone]

## 2020-08-20 NOTE — ED Notes (Signed)
Pt placed on 2L [place] per oxygen saturation 89%

## 2020-08-20 NOTE — H&P (Addendum)
Date: 08/20/2020               Patient Name:  Christie Holder MRN: [id number]  DOB: 1970-08-17 Age / Sex: 50 y.o., female   PCP: Patient, [name]         Medical Service: Internal Medicine Teaching Service         Attending Physician: Dr. Velna Ochs    First Contact: Dr. Konrad Penta Pager: [phone]  Second Contact: Dr. Marva Panda Pager: [phone]       After Hours (After 5p/  First Contact Pager: [phone]  weekends / holidays): Second Contact Pager: [phone]   Chief Complaint: fever, chills, abdominal/flank pain  History of Present Illness:   [name] is a 50 year old woman with history of gestational diabetes who presents with fever, chills, abdominal and flank pain, myalgias in the setting of outpatient treatment for UTI.  A Spanish interpretor was used during this patient encounter.  The patient reports she was in her usual state of health until a week ago last Sunday (5/22) when she developed dysuria and noticed her urine was malodorous and had blood in it. She was seen by a doctor who prescribed Bactrim the next day (5/23). She states she took the Bactrim as prescribed for the next 3 days, however she did not have any improvement in her symptoms. She called the doctor who prescribed Macrobid, which the patient started yesterday (5/28). She reports her symptoms progressed a couple of days ago to include left-sided abdominal pain which radiated to her L flank and ultimately became generalized. Additionally, she developed subjective fever, chills, headache, generalized weakness, lightheadedness, nausea and was brought to the ED earlier today by a family member after she had a fall at home. Reports she did not hit her head or lose consciousness. States she had similar symptoms ~8 years ago when she was pregnant with one of her children and was admitted to the hospital for a kidney infection. She denies neck stiffness, focal weakness, diarrhea, blood in her stool,  rashes. States she initially had improvement in her abdominal pain with treatment in the ED but that it is wearing off.   She states she does not see a doctor for regular check-ups because she does not have health insurance. States she is interested in obtaining insurance and establishing with a primary care doctor. She notes her diet has been poor since the pandemic and with the stress of home and work life she has gained weight.  ED course: Febrile (Tmax 102.9), RR 15-20, P 70s-90s, BP 90s-110s/50s-60s, SpO2 >93% on room air. CBC significant for leukocytosis (12.6). BMP with mild hyponatremia (133), glucose 117, AST/ALT/ALP/TBili wnl. Beta-hcg <5. Lactic acid 1.1. Lipase 29. COVID/flu negative. Urinalysis with negative nitrite, trace leuks (6-10), negative hgb (6-10 RBC/HPF), rare bacteria. CXR negative. CT abdomen pelvis negative for urinary tract calculi however showed haziness surrounding the L kidney and promixal third of the left ureter suggestive of pyelonephritis vs recently passed calculus, hepatic steatosis, aortic atherosclerosis, large infraumbilical ventral hernia without bowel incarceration. She received 2 L NS, Zofran 4 mg IV, morphine 4 mg IV, and 1 g ceftriaxone. IMTS consulted for admission.  Meds: no current medications  Allergies: Allergies as of 08/20/2020  . (No Known Allergies)   Past Medical History:  Diagnosis Date  . Gestational diabetes 10/10/2011    Family History  Problem Relation Age of Onset  . Diabetes Maternal Grandmother    Social History: Lives in [place]  with her husband and 4 children. Works in Publishing copy. Denies cigarette smoking, alcohol, or illicit drug use. She is vaccinated for COVID x 2 but not boosted.  Review of Systems: A complete ROS was negative except as per HPI.   Physical Exam: Blood pressure 107/60, pulse 80, temperature 100.2 F (37.9 C), temperature source Oral, resp. rate (!) 23, SpO2 97 %. Constitutional:  tired- and uncomfortable-appearing woman lying in bed, blanket pulled up to chin, periodic chills HENT: normocephalic atraumatic, mucous membranes dry Eyes: conjunctiva non-erythematous Neck: supple, no lymphadenopathy Cardiovascular: regular rate and rhythm, no m/r/g, no lower extremity edema Pulmonary/Chest: normal work of breathing on room air, lungs clear to auscultation bilaterally Abdominal: soft, non-distended; diffuse abdominal tenderness and guarding with palpation, LUQ and LLQ > RUQ and LLQ, with bilateral flank pain, also L>R, and bilateral CVA tenderness, L>R.  MSK: normal bulk and tone, no neck stiffness Neurological: alert & oriented x 3, PERRL Skin: warm and dry, piloerection of arms and legs Psych: normal mood and affect  EKG: regular rate and rhythm  CXR: no focal infiltrate, no effusion. Ill defined right heart border.  CT abdomen/pelvis: subtle haziness and perinephritc, periureteric stranding associated with the left kidney and ureter. No nephrolithiasis or hydronephrosis.   Assessment & Plan by Problem: Principal Problem:   Complicated UTI (urinary tract infection) Active Problems:   Hematuria   Aortic atherosclerosis (HCC)   Hyperglycemia   Hepatic steatosis  [name] is a 50 year old woman with history of gestational diabetes who presents with fever, chills, abdominal and flank pain, myalgias in the setting of outpatient treatment for UTI and admitted for sepsis secondary to pyelonephritis.   Sepsis secondary to pyelonephritis Sepsis criteria met with fever (102.67F), tachycardia (108), WBC 12.6. UA is not overly impressive however she was treated with Bactrim for 3 days which was switched to Macrobid 1.5 days ago. Physical exam notable for L>R abdominal, flank, and CVA tenderness. Abdominal CT is suggestive of some inflammation around the left kidney/ureter which, in the clinical context, is likely pyelonephritis. Also of note, right heart border is not  well defined on CXR however low suspicion for infectious pulmonary source at this time. Plan - obtain a urine and blood cultures - s/p 1 g ceftriaxone in the ED. Will continue ceftriaxone. Transition to po once she defervesces.  - 0.105m IV dilaudid q2h prn for now since she is still nauseated. Once nausea subsides, can transition to po - prn zofran for nausea - LR @ 100 cc/hr  Hematuria. Likely secondary to pyelonephritis. On chart review, it appears that there was blood in her urine in 2015 as well however this was in the context of admission for incomplete miscarriage. - will need repeat UA in 4-6w to ensure resolution  Acute normocytic anemia. Appears to be down around 1g from baseline. Would not suspect this to be from the hematuria that she had noted prior to admission. No other overt signs of bleeding. -check iron panel -continue to monitor  Mild hyponatremia. Appears chronic and stable. No further workup at this time.  Hyperglycemia.  -Check A1C.   Aortic Atherosclerosis -check lipid panel  Hepatic steatosis  Ventral hernia as noted on CT. No evidence of incarceration.  PCP needs Patient is uninsured and needs to establish with a PCP. Will have her follow up in our clinic after discharge.  Dispo: Admit patient to Inpatient with expected length of stay greater than 2 midnights.  Signed: JAlexandria Lodge MD Internal Medicine Resident,  PGY-1 [facility] Pager: [phone] 9:24 PM, 08/20/2020

## 2020-08-20 NOTE — ED Triage Notes (Addendum)
Patient complains of ongoing flank pain and dysuria x 4-5 days. Started on sulfa and then switched to macrobid and taking ibuprofen for fever and chills. States that she is feeling no better and here for further evaluation. Patent had ibuprofen at noon

## 2020-08-20 NOTE — ED Notes (Signed)
ED Provider at bedside. 

## 2020-08-20 NOTE — ED Provider Notes (Signed)
Medical Decision Making: Care of patient assumed from Dr. Rush Landmark at [phone].  Agree with history, physical exam and plan.  See their note for further details.  Briefly, The pt p/w patient resting comfortably.  Urinalysis pending symptoms of UTI having been on 2 different courses of antibiotics over the last week..   Current plan is as follows: CT abdomen pelvis repeat urinalysis  CT scan shows stranding inflammatory changes consistent with possible recent passage of stone versus pyelonephritis.  Urinalysis shows some inflammatory markers consistent with UTI.  Patient will get additional fluid bolus and then be admitted for IV antibiotics and cultures to be sent.  The patient will be admitted to the hospitalist.  For the remainder this patient's care please see inpatient team notes.  I will intervene as needed while the patient remains in the emergency department.    I personally reviewed and interpreted all labs/imaging.      Sabino Donovan, MD 08/20/20 [phone]

## 2020-08-20 NOTE — ED Notes (Signed)
RN paged internal medicine per patient oral temperature

## 2020-08-21 DIAGNOSIS — A419 Sepsis, unspecified organism: Principal | ICD-10-CM

## 2020-08-21 LAB — CBC
HCT: 32.9 % — ABNORMAL LOW (ref 36.0–46.0)
Hemoglobin: 11.1 g/dL — ABNORMAL LOW (ref 12.0–15.0)
MCH: 32.2 pg (ref 26.0–34.0)
MCHC: 33.7 g/dL (ref 30.0–36.0)
MCV: 95.4 fL (ref 80.0–100.0)
Platelets: 185 10*3/uL (ref 150–400)
RBC: 3.45 MIL/uL — ABNORMAL LOW (ref 3.87–5.11)
RDW: 12.4 % (ref 11.5–15.5)
WBC: 11 10*3/uL — ABNORMAL HIGH (ref 4.0–10.5)
nRBC: 0 % (ref 0.0–0.2)

## 2020-08-21 LAB — IRON AND TIBC
Iron: 20 ug/dL — ABNORMAL LOW (ref 28–170)
Saturation Ratios: 8 % — ABNORMAL LOW (ref 10.4–31.8)
TIBC: 256 ug/dL (ref 250–450)
UIBC: 236 ug/dL

## 2020-08-21 LAB — BASIC METABOLIC PANEL
Anion gap: 7 (ref 5–15)
BUN: 7 mg/dL (ref 6–20)
CO2: 25 mmol/L (ref 22–32)
Calcium: 8.4 mg/dL — ABNORMAL LOW (ref 8.9–10.3)
Chloride: 101 mmol/L (ref 98–111)
Creatinine, Ser: 0.62 mg/dL (ref 0.44–1.00)
GFR, Estimated: >60 mL/min (ref >60)
Glucose, Bld: 117 mg/dL — ABNORMAL HIGH (ref 70–99)
Potassium: 3.9 mmol/L (ref 3.5–5.1)
Sodium: 133 mmol/L — ABNORMAL LOW (ref 135–145)

## 2020-08-21 LAB — HIV ANTIBODY (ROUTINE TESTING W REFLEX): HIV Screen 4th Generation wRfx: NONREACTIVE

## 2020-08-21 LAB — FERRITIN: Ferritin: 220 ng/mL (ref 11–307)

## 2020-08-21 LAB — LIPID PANEL
Cholesterol: 134 mg/dL (ref 0–200)
HDL: 39 mg/dL — ABNORMAL LOW (ref >40)
LDL Cholesterol: 80 mg/dL (ref 0–99)
Total CHOL/HDL Ratio: 3.4 RATIO
Triglycerides: 75 mg/dL (ref <150)
VLDL: 15 mg/dL (ref 0–40)

## 2020-08-21 MED ORDER — LACTATED RINGERS IV BOLUS
1000.0000 mL | Freq: Once | INTRAVENOUS | Status: AC
  Administered 2020-08-21: 1000 mL via INTRAVENOUS

## 2020-08-21 NOTE — Plan of Care (Signed)
  Problem: Education: Goal: Knowledge of General Education information will improve Description: Including pain rating scale, medication(s)/side effects and non-pharmacologic comfort measures Outcome: Progressing   Problem: Activity: Goal: Risk for activity intolerance will decrease Outcome: Progressing   Problem: Coping: Goal: Level of anxiety will decrease Outcome: Progressing   

## 2020-08-21 NOTE — Progress Notes (Signed)
PT Cancellation Note  Patient Details Name: Christie Holder MRN: [id number] DOB: 20-Jan-1971   Cancelled Treatment:    Reason Eval/Treat Not Completed: Other (comment) Pt deferring due to nausea/vomiting.  Lillia Pauls, PT, DPT Acute Rehabilitation Services Pager [phone] Office [phone]    Norval Morton 08/21/2020, 3:13 PM

## 2020-08-21 NOTE — ED Notes (Signed)
RN attempted to call report x1 

## 2020-08-21 NOTE — Progress Notes (Signed)
Subjective:   This morning, [name] says she struggled most PTA with fevers and myalgias, and although these continue to improve, notes severe persistent headache associated with light-headedness and dizziness. Denies any neck stiffness. She has felt slightly nauseas although has not vomited. She endorses bilateral (L > R) abdominal and flank pain, although notes the latter has significantly improved since admission. She tolerated applesauce and juice well this morning and has a strong appetite for lunch. Denies any other symptoms.   Spanish Interpreter service was used throughout patient encounter.   Objective:  Vital signs in last 24 hours: Vitals:   08/21/20 0022 08/21/20 0056 08/21/20 0422 08/21/20 0815  BP:  106/61 113/66 101/61  Pulse:  78 92 71  Resp:  18 18 19   Temp: 99.6 F (37.6 C) 99.9 F (37.7 C) 99.1 F (37.3 C) 98.3 F (36.8 C)  TempSrc: Oral Oral Oral Oral  SpO2:  100% 100% 100%   General: Patient is obese. She is laying down and appears uncomfortable, although in no acute distress.  Eyes: Sclera non-icteric. No conjunctival injection.  Respiratory: Lungs are CTA, bilaterally. No wheezes, rales, or rhonchi. Normal effort of breathing.  Cardiovascular: Regular rate and rhythm. No murmurs, rubs, or gallops. No lower extremity edema.  Abdominal: Abdomen is soft and non-distended. There is significant tenderness over the LLQ with voluntary guarding and mild tenderness over the RLQ. No suprapubic or upper abdominal tenderness. Bowel sounds are intact throughout. No rebound tenderness.  GU: There is mild left CVA and no appreciable right CVA tenderness to palpation.  Skin: No lesions. No rashes.  Psych: Normal affect. Normal tone of voice.   Assessment/Plan:  Principal Problem:   Complicated UTI (urinary tract infection) Active Problems:   Hematuria   Aortic atherosclerosis (HCC)   Hyperglycemia   Hepatic steatosis  [name] is a 50 year old  woman with history of gestational diabetes and remote pyelonephritis presenting with worsening symptoms following treatment of UTI, admitted for sepsis 2/2 pyelonephritis.    Sepsis Secondary to Pyelonephritis  T Max overnight was 103.2, BP's improved s/p fluids, and tachypnea has resolved. Urine culture only showed trace leukocytes with rare bacteria, and 6-10 WBC and WBC, although she had been on ABX PTA. CT was consistent with left-sided pyelonephritis. She continues to have L > R lower abdominal and flank pain, although WBC and symptoms have improved since admission on ceftriaxone, tylenol and IV dilaudid PRN.  - continue ceftriaxone for now  - urine and blood cultures are pending  - PRN Zofran for nausea  - Tylenol 650mg  q6 hours PRN for mild-moderate pain  - Dilaudid 0.5mg  IV q2hr PRN for severe pain  - Continue LR 100cc/hr for now, although will likely be able to discontinue this soon if she tolerates PO well and fevers abate - Given hematuria in 2015 as well (despite admission for incomplete miscarriage at the time) will need repeat UA in 4-6 weeks to monitor for resolution  Acute normocytic Anemia  Hemoglobin dropped ~2 pts from admission and patient does not typically have anemia at baseline. Suspect acute drop since yesterday to be dilutional with heavy fluids, although she may have anemia in the setting of acute/chronic urinary blood loss. Iron studies show low iron and iron saturation (20/8 respectively) with normal TIBC and ferritin of 220. It is highly possible she has iron deficiency anemia that is masked by acute elevation in ferritin level in the setting of active infection (vs. Anemia of chronic disease).  -  Continue to trend daily CBC's - Would benefit from repeat iron panel once acute infection abates   Aortic Atherosclerosis  Hepatic Steatosis  Hyperglycemia  Lipid panel showed LDL of 80, HDL 39 and was otherwise unremarkable. 10-year ASCVD risk is low at 0.6% so statin  therapy is not indicated currently, although Hgb A1c is pending.  - F/u Hgb A1c  - Will discuss potential for [facility] follow up tomorrow (for insurance as well)  Prior to Admission Living Arrangement: Home w/ family  Anticipated Discharge Location: Home, pending PT/OT evaluation  Barriers to Discharge: Continued IV ABX  Dispo: Anticipated discharge in approximately 1-3 days.  Christie Bayley, MD PGY1 08/21/2020, 11:44 AM Pager: [phone] After 5pm on weekdays and 1pm on weekends: On Call pager [phone]

## 2020-08-21 NOTE — ED Notes (Signed)
RN paged internal medicine provider per pt BP 90/47

## 2020-08-21 NOTE — Hospital Course (Signed)
H F/U:  - hepatic steatosis  - large infraumbilical ventral hernia (omental fat)  - Aortic atherosclerosis  - needs insurance (and PCP) - to clinic - Given hematuria in 2015 as well (despite admission for incomplete miscarriage at the time) will need repeat UA in 4-6 weeks to monitor for resolution - A1c?

## 2020-08-22 DIAGNOSIS — N12 Tubulo-interstitial nephritis, not specified as acute or chronic: Secondary | ICD-10-CM

## 2020-08-22 LAB — RENAL FUNCTION PANEL
Albumin: 2.9 g/dL — ABNORMAL LOW (ref 3.5–5.0)
Anion gap: 6 (ref 5–15)
BUN: <5 mg/dL — ABNORMAL LOW (ref 6–20)
CO2: 30 mmol/L (ref 22–32)
Calcium: 8.5 mg/dL — ABNORMAL LOW (ref 8.9–10.3)
Chloride: 97 mmol/L — ABNORMAL LOW (ref 98–111)
Creatinine, Ser: 0.55 mg/dL (ref 0.44–1.00)
GFR, Estimated: >60 mL/min (ref >60)
Glucose, Bld: 123 mg/dL — ABNORMAL HIGH (ref 70–99)
Phosphorus: 2.5 mg/dL (ref 2.5–4.6)
Potassium: 3.7 mmol/L (ref 3.5–5.1)
Sodium: 133 mmol/L — ABNORMAL LOW (ref 135–145)

## 2020-08-22 LAB — CBC
HCT: 30.7 % — ABNORMAL LOW (ref 36.0–46.0)
Hemoglobin: 10.5 g/dL — ABNORMAL LOW (ref 12.0–15.0)
MCH: 32.5 pg (ref 26.0–34.0)
MCHC: 34.2 g/dL (ref 30.0–36.0)
MCV: 95 fL (ref 80.0–100.0)
Platelets: 194 10*3/uL (ref 150–400)
RBC: 3.23 MIL/uL — ABNORMAL LOW (ref 3.87–5.11)
RDW: 12 % (ref 11.5–15.5)
WBC: 8.9 10*3/uL (ref 4.0–10.5)
nRBC: 0 % (ref 0.0–0.2)

## 2020-08-22 LAB — HEMOGLOBIN A1C
Hgb A1c MFr Bld: 5.1 % (ref 4.8–5.6)
Mean Plasma Glucose: 100 mg/dL

## 2020-08-22 MED ORDER — POLYETHYLENE GLYCOL 3350 17 G PO PACK
17.0000 g | PACK | Freq: Every day | ORAL | Status: DC
  Administered 2020-08-22 – 2020-08-23 (×2): 17 g via ORAL
  Filled 2020-08-22 (×2): qty 1

## 2020-08-22 MED ORDER — LACTATED RINGERS IV BOLUS
1000.0000 mL | Freq: Once | INTRAVENOUS | Status: AC
  Administered 2020-08-22: 1000 mL via INTRAVENOUS

## 2020-08-22 MED ORDER — SENNOSIDES-DOCUSATE SODIUM 8.6-50 MG PO TABS
1.0000 | ORAL_TABLET | Freq: Every day | ORAL | Status: DC
  Administered 2020-08-22: 1 via ORAL
  Filled 2020-08-22: qty 1

## 2020-08-22 NOTE — Progress Notes (Signed)
Pt had emesis of clear yellow green emesis. 4mg  tablet administered due to no IV at the moment. Educated on Incentive spirometer and discontinued use of oxygen at the moment. Pt torelating RA.

## 2020-08-22 NOTE — TOC Initial Note (Signed)
Transition of Care [facility]) - Initial/Assessment Note    Patient Details  Name: Christie Holder MRN: [id number] Date of Birth: Feb 07, 1971  [facility]) CM/SW Contact:    Kingsley Plan, RN Phone Number: 08/22/2020, 3:13 PM  Clinical Narrative:                 Patient from home no insurance.   No PCP   NCM called [facility] first available appointment is October 02, 2020. Dr Laddie Aquas aware and they will follow her in their clinic. NCM cancelled appointment at [facility].   NCM will assist with discharge medications through [facility] and [facility] program.    Expected Discharge Plan: Home/Self Care     Patient Goals and CMS Choice   CMS Medicare.gov Compare Post Acute Care list provided to:: Patient    Expected Discharge Plan and Services Expected Discharge Plan: Home/Self Care In-house Referral: Interpreting Services,Financial Counselor Discharge Planning Services: CM Consult,Indigent [facility] Program,Medication Assistance   Living arrangements for the past 2 months: Single Family Home                 DME Arranged: N/A         HH Arranged: NA          Prior Living Arrangements/Services Living arrangements for the past 2 months: Single Family Home Lives with:: Spouse Patient language and need for interpreter reviewed:: Yes Do you feel safe going back to the place where you live?: Yes            Criminal Activity/Legal Involvement Pertinent to Current Situation/Hospitalization: No - Comment as needed  Activities of Daily Living Home Assistive Devices/Equipment: None ADL Screening (condition at time of admission) Patient's cognitive ability adequate to safely complete daily activities?: Yes Is the patient deaf or have difficulty hearing?: No Does the patient have difficulty seeing, even when wearing glasses/contacts?: No Does the patient have difficulty concentrating, remembering,  or making decisions?: No Patient able to express need for assistance with ADLs?: Yes Does the patient have difficulty dressing or bathing?: No Independently performs ADLs?: Yes (appropriate for developmental age) Does the patient have difficulty walking or climbing stairs?: No Weakness of Legs: None Weakness of Arms/Hands: None  Permission Sought/Granted   Permission granted to share information with : No              Emotional Assessment              Admission diagnosis:  Dysuria [R30.0] Nausea [R11.0] Generalized abdominal pain [R10.84] Complicated UTI (urinary tract infection) [N39.0] Fever, unspecified fever cause [R50.9] Patient Active Problem List   Diagnosis Date Noted  . Sepsis without acute organ dysfunction (HCC)   . Hematuria 08/20/2020  . Aortic atherosclerosis (HCC) 08/20/2020  . Hyperglycemia 08/20/2020  . Hepatic steatosis 08/20/2020  . Complicated UTI (urinary tract infection) 08/20/2020  . Cholelithiasis 10/02/2016  . Symptomatic cholelithiasis 10/01/2016  . Complete miscarriage 06/23/2013  . History of macrosomia in infant in prior pregnancy, currently pregnant 12/02/2011  . Pyelonephritis of left kidney 10/22/2011  . Obesity 07/04/2011   PCP:  Patient, [name] Pharmacy:   [id number] [place], [place] - [address] [place] [address] Phone: [phone] Fax: [phone]  [facility] [address] [place] [address] Phone: [phone] Fax: [phone]     Social Determinants of Health (SDOH) Interventions    Readmission  Risk Interventions No flowsheet data found.

## 2020-08-22 NOTE — Progress Notes (Signed)
Physical Therapy Evaluation Patient Details Name: Christie Holder MRN: [id number] DOB: 06-15-1970 Today's Date: 08/22/2020   History of Present Illness  [name] is a 50 year old woman with history of gestational diabetes who presents with fever, chills, abdominal and flank pain, myalgias in the setting of outpatient treatment for UTI  Clinical Impression  Pt was seen for mobility in her room, but continues to complain of light headed feelings and HA.  Her BP readings were normal:  Supine 117/64, sat 97%;  Sitting 119/71; standing 121/71.  Continue to see her to go over stairs and take a longer walk, then should be fine for DC.  HA was mainly over R temporal area, noted MD is aware in her chart note.  Nursing in to medicate the HA, while PT encouraged her to ice the affected areas.  Pt is appreciative of the ice, will continue to see for mobility and dc.      Follow Up Recommendations No PT follow up    Equipment Recommendations  None recommended by PT    Recommendations for Other Services       Precautions / Restrictions Precautions Precautions: None Precaution Comments: stable BP's Restrictions Weight Bearing Restrictions: No      Mobility  Bed Mobility Overal bed mobility: Modified Independent             General bed mobility comments: pt laying on coush upon arrival    Transfers Overall transfer level: Needs assistance Equipment used: None Transfers: Sit to/from Stand Sit to Stand: Supervision         General transfer comment: supervision for safety  Ambulation/Gait Ambulation/Gait assistance: Modified independent (Device/Increase time) Gait Distance (Feet): 40 Feet Assistive device: None Gait Pattern/deviations: Step-through pattern;Wide base of support Gait velocity: reduced Gait velocity interpretation: <1.31 ft/sec, indicative of household ambulator General Gait Details: walking in her room without assist, no LOB  Stairs             Wheelchair Mobility    Modified Rankin (Stroke Patients Only)       Balance Overall balance assessment: No apparent balance deficits (not formally assessed)                                           Pertinent Vitals/Pain Pain Assessment: 0-10 Pain Score: 4  Pain Location: L UE Pain Descriptors / Indicators: Discomfort;Numbness Pain Intervention(s): Monitored during session;Repositioned    Home Living Family/patient expects to be discharged to:: Private residence Living Arrangements: Spouse/significant other;Children Available Help at Discharge: Family;Available PRN/intermittently Type of Home: House       Home Layout: One level Home Equipment: None      Prior Function Level of Independence: Independent         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 LUE Sensation: decreased light touch    Lower Extremity Assessment Lower Extremity Assessment: Overall WFL for tasks assessed (strength and sensation on LE's WFL)    Cervical / Trunk Assessment Cervical / Trunk Assessment: Normal  Communication   Communication: No difficulties  Cognition Arousal/Alertness: Awake/alert Behavior During Therapy:  WFL for tasks assessed/performed Overall Cognitive Status: Within Functional Limits for tasks assessed                                 General Comments: pt is clearly describing the HA      General Comments General comments (skin integrity, edema, etc.): pt is light headed at times when up, monitored orthostatics with no measures of BP indicating a drop    Exercises     Assessment/Plan    PT Assessment  Patient needs continued PT services  PT Problem List Pain       PT Treatment Interventions Gait training;Stair training;Functional mobility training;Therapeutic activities;Therapeutic exercise;Patient/family education    PT Goals (Current goals can be found in the Care Plan section)  Acute Rehab PT Goals Patient Stated Goal: to get rid of HA PT Goal Formulation: With patient Time For Goal Achievement: 08/25/20 Potential to Achieve Goals: Good    Frequency Min 3X/week   Barriers to discharge   no known barriers    Co-evaluation               AM-PAC PT "6 Clicks" Mobility  Outcome Measure Help needed turning from your back to your side while in a flat bed without using bedrails?: None Help needed moving from lying on your back to sitting on the side of a flat bed without using bedrails?: None Help needed moving to and from a bed to a chair (including a wheelchair)?: None Help needed standing up from a chair using your arms (e.g., wheelchair or bedside chair)?: None Help needed to walk in hospital room?: A Little Help needed climbing 3-5 steps with a railing? : A Little 6 Click Score: 22    End of Session   Activity Tolerance: Patient limited by pain Patient left: in bed;with call bell/phone within reach;with nursing/sitter in room Nurse Communication: Mobility status PT Visit Diagnosis: Pain Pain - Right/Left: Right Pain - part of body:  (temporal portion of head)    Time: 8127-5170 PT Time Calculation (min) (ACUTE ONLY): 30 min   Charges:   PT Evaluation $PT Eval Moderate Complexity: 1 Mod PT Treatments $Gait Training: 8-22 mins       Ivar Drape 08/22/2020, 1:02 PM Samul Dada, PT MS Acute Rehab Dept. Number: [facility] [phone] and [facility] [phone]

## 2020-08-22 NOTE — Progress Notes (Signed)
Occupational Therapy Treatment Patient Details Name: Christie Holder MRN: [id number] DOB: 06-Jul-1970 Today's Date: 08/22/2020    History of present illness [name] is a 50 year old woman with history of gestational diabetes who presents with fever, chills, abdominal and flank pain, myalgias in the setting of outpatient treatment for UTI   OT comments  Pt moving well and no assist required for ADLs/selfcare, but reports mild  light headed feelings and pain in L UE/pal of hand since onset of symptoms that hospitalized her. Pt with B UE AROM/function WNLs with Good strength. No further acute OT services are indicated at this time. OT will sign off. BP readings:   Supine 117/64, O2 98% Sitting 119/71, O2 97% Standing 121/71, O2 97-98%  Follow Up Recommendations  No OT follow up    Equipment Recommendations  None recommended by OT    Recommendations for Other Services      Precautions / Restrictions Precautions Precautions: None Precaution Comments: stable BP's Restrictions Weight Bearing Restrictions: No       Mobility Bed Mobility Overal bed mobility: Modified Independent             General bed mobility comments: pt laying on coush upon arrival    Transfers Overall transfer level: Needs assistance Equipment used: None Transfers: Sit to/from Stand Sit to Stand: Supervision         General transfer comment: supervision for safety    Balance Overall balance assessment: No apparent balance deficits (not formally assessed)                                         ADL either performed or assessed with clinical judgement   ADL                                         General ADL Comments: sup for safety with ADLs/selfcare     Vision Patient Visual Report: No change from baseline     Perception     Praxis      Cognition Arousal/Alertness: Awake/alert Behavior During Therapy: WFL for tasks  assessed/performed Overall Cognitive Status: Within Functional Limits for tasks assessed                                 General Comments: pt is clearly describing the HA        Exercises Exercises: Other exercises (BLE strength WFL)   Shoulder Instructions       General Comments pt is light headed at times when up, monitored orthostatics with no measures of BP indicating a drop    Pertinent Vitals/ Pain       Pain Assessment: 0-10 Pain Score: 4  Pain Location: L UE Pain Descriptors / Indicators: Discomfort;Numbness Pain Intervention(s): Monitored during session;Repositioned  Home Living Family/patient expects to be discharged to:: Private residence Living Arrangements: Spouse/significant other;Children Available Help at Discharge: Family;Available PRN/intermittently Type of Home: House       Home Layout: One level     Bathroom Shower/Tub: Tub/shower unit;Walk-in shower   Bathroom Toilet: Standard     Home Equipment: None          Prior Functioning/Environment Level of Independence: Independent        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           Progress Toward Goals  OT Goals(current goals can now be found in the care plan section)     Acute Rehab OT Goals Patient Stated Goal: go home  Plan      Co-evaluation                 AM-PAC OT "6 Clicks" Daily Activity     Outcome Measure   Help from another person eating meals?: None Help from another person taking care of personal grooming?: None Help from another person toileting, which includes using toliet, bedpan, or urinal?: None Help from another person bathing (including washing, rinsing, drying)?: None Help from another person to put on and taking off regular upper body clothing?: None Help from another person to put on and taking off regular lower body clothing?: None 6 Click Score: 24    End of Session Equipment Utilized During  Treatment: Gait belt  OT Visit Diagnosis: Pain (L UE, palm of hand) Pain - Right/Left: Left Pain - part of body: Arm;Hand   Activity Tolerance Patient tolerated treatment well   Patient Left Other (comment) (seated on couch)   Nurse Communication          Time: 6811-5726 OT Time Calculation (min): 20 min  Charges: OT General Charges $OT Visit: 1 Visit OT Evaluation $OT Eval Moderate Complexity: 1 Mod     Galen Manila 08/22/2020, 1:19 PM

## 2020-08-22 NOTE — Plan of Care (Signed)

## 2020-08-22 NOTE — Progress Notes (Signed)
Subjective:   This morning, [name] says she's overall continuing to feel better although has a persistent headache with nausea and 3 episodes of yellow-green emesis since yesterday. She does not feel she is able to keep up with needed fluids and does have significant light-headedness and dizziness worse with standing and exertion. Her nausea did improve with Zofran yesterday. Denies abdominal pain, fevers, chills, and says her abdominal and flank pain continue to improve.   Spanish translator was used throughout today's conversation.  Objective:  Vital signs in last 24 hours: Vitals:   08/21/20 1319 08/21/20 2013 08/22/20 0405 08/22/20 1250  BP: 121/66 (!) 104/50 (!) 103/55 (!) 105/59  Pulse: 86 67 70 63  Resp: 18 15 16    Temp: 99 F (37.2 C) 98.6 F (37 C) 98.5 F (36.9 C) 98.5 F (36.9 C)  TempSrc: Oral Oral Oral Oral  SpO2: 100% 99% 97% 98%   General: Patient is sitting up and appears slightly uncomfortable although is in no acute distress.   Respiratory: Lungs are CTA, bilaterally. No wheezes, rales, or rhonchi. Normal effort of breathing.  Cardiovascular: Regular rate and rhythm. No murmurs, rubs, or gallops. No lower extremity edema.  Abdominal: Abdomen is soft and non-distended with minimal lower extremity tenderness, without guarding or palpable mass. Bowel sounds are intact throughout. No rebound.  GU: There is minimal right and left-sided CVA tenderness.   Psych: Normal affect. Normal tone of voice.   Assessment/Plan:  Principal Problem:   Pyelonephritis of left kidney Active Problems:   Hematuria   Aortic atherosclerosis (HCC)   Hyperglycemia   Hepatic steatosis   Complicated UTI (urinary tract infection)   Sepsis without acute organ dysfunction [facility])  [name] is a 50 year old woman with history of gestational diabetes and remote pyelonephritis presenting with worsening symptoms following treatment of UTI, admitted for sepsis 2/2  pyelonephritis.    Sepsis Secondary to Pyelonephritis, Improving   Patient has remained afebrile and WBC has normalized on IV ceftriaxone. She has had difficulty keeping up with PO intake due to persistent nausea and emesis since yesterday, although her abdominal and flank pain continue to improve and she denies dysuria. Orthostatic vital signs with therapy normalized after 1L LR bolus this morning.  - Continue IV ceftriaxone  - Blood cultures negative to date - Urine cultures pending - Zofran 4mg  q6hrs PRN for nausea  - Tylenol 650mg  q6 hours PRN for mild-moderate pain  - Dilaudid 0.5mg  IV q2hr PRN for severe pain  - Will add back maintenance fluids if she is not able to tolerate PO intake despite increased frequency of zofran  - Outpatient UA follow up in 4-6 weeks to monitor hematuria   Acute normocytic Anemia  Hemoglobin dropped ~2.5 pts from admission and patient does not typically have anemia at baseline. Suspect acute drop since yesterday to be dilutional with heavy fluids, although she may have menstrual blood loss vs. Urinary blood loss. Iron studies show low iron and iron saturation (20/8 respectively) with normal TIBC and ferritin of 220. It is highly possible she has iron deficiency anemia that is masked by acute elevation in ferritin level in the setting of active infection (vs. Anemia of chronic disease).  - Continue to trend daily CBC's - Repeat iron studies outpatient   Aortic Atherosclerosis  Hepatic Steatosis  Hyperglycemia  Lipid panel showed LDL of 80, HDL 39 and was otherwise unremarkable. Hgb A1c normal at 5.1%. 10-year ASCVD risk is low at 0.6% so statin therapy  is not indicated currently although she may benefit from ASA therapy daily once hematuria and anemia have stabilized / resolved. - Will have her call to schedule [facility] follow up appointment upon discharge to discuss CVD risk factor modification outpatient   Prior to Admission Living Arrangement: Home w/ family   Anticipated Discharge Location: Home w/o HH services  Barriers to Discharge: Continued IV ABX  Dispo: Anticipated discharge tomorrow.   Glenford Bayley, MD PGY1 08/22/2020, 1:51 PM Pager: [phone] After 5pm on weekdays and 1pm on weekends: On Call pager [phone]

## 2020-08-23 ENCOUNTER — Other Ambulatory Visit (HOSPITAL_COMMUNITY): Payer: Self-pay

## 2020-08-23 LAB — CBC
HCT: 31 % — ABNORMAL LOW (ref 36.0–46.0)
Hemoglobin: 10.7 g/dL — ABNORMAL LOW (ref 12.0–15.0)
MCH: 32.7 pg (ref 26.0–34.0)
MCHC: 34.5 g/dL (ref 30.0–36.0)
MCV: 94.8 fL (ref 80.0–100.0)
Platelets: 229 10*3/uL (ref 150–400)
RBC: 3.27 MIL/uL — ABNORMAL LOW (ref 3.87–5.11)
RDW: 11.9 % (ref 11.5–15.5)
WBC: 7.2 10*3/uL (ref 4.0–10.5)
nRBC: 0 % (ref 0.0–0.2)

## 2020-08-23 LAB — RENAL FUNCTION PANEL
Albumin: 3 g/dL — ABNORMAL LOW (ref 3.5–5.0)
Anion gap: 10 (ref 5–15)
BUN: 5 mg/dL — ABNORMAL LOW (ref 6–20)
CO2: 28 mmol/L (ref 22–32)
Calcium: 9 mg/dL (ref 8.9–10.3)
Chloride: 100 mmol/L (ref 98–111)
Creatinine, Ser: 0.57 mg/dL (ref 0.44–1.00)
GFR, Estimated: >60 mL/min (ref >60)
Glucose, Bld: 138 mg/dL — ABNORMAL HIGH (ref 70–99)
Phosphorus: 2.8 mg/dL (ref 2.5–4.6)
Potassium: 3.6 mmol/L (ref 3.5–5.1)
Sodium: 138 mmol/L (ref 135–145)

## 2020-08-23 MED ORDER — ACETAMINOPHEN 650 MG RE SUPP: 650.0000 mg | Freq: Four times a day (QID) | RECTAL | Status: DC | PRN

## 2020-08-23 MED ORDER — LACTATED RINGERS IV BOLUS: 1000.0000 mL | Freq: Once | INTRAVENOUS | Status: DC

## 2020-08-23 MED ORDER — ACETAMINOPHEN 325 MG PO TABS: 650.0000 mg | ORAL_TABLET | Freq: Four times a day (QID) | ORAL | Status: DC | PRN

## 2020-08-23 MED ORDER — CEFDINIR 300 MG PO CAPS
300.0000 mg | ORAL_CAPSULE | Freq: Two times a day (BID) | ORAL | 0 refills | Status: AC
  Filled 2020-08-23: qty 12, 6d supply, fill #0

## 2020-08-23 MED ORDER — ONDANSETRON HCL 4 MG PO TABS
4.0000 mg | ORAL_TABLET | Freq: Four times a day (QID) | ORAL | 0 refills | Status: DC | PRN
  Filled 2020-08-23: qty 20, 5d supply, fill #0

## 2020-08-23 NOTE — Progress Notes (Signed)
Physical Therapy Treatment and discharge   Patient Details Name: Christie Holder MRN: [id number] DOB: 1970-07-22 Today's Date: 08/23/2020    History of Present Illness [name] is a 50 year old woman who was admitted on 08/20/20 with Sepsis Secondary to Pyelonephritis.  She has history of gestational diabetes.    PT Comments    Pt ambulating in room independently at arrival and was able to ambulate 200' in hall and perform stairs.  Demonstrates good balance and safety.  She reports only limiting factor is severe headache causing dizziness that is constant and does not depend on position.  Despite headache/dizziness still demonstrates safe mobility and has no further PT needs.     Follow Up Recommendations  No PT follow up     Equipment Recommendations  None recommended by PT    Recommendations for Other Services       Precautions / Restrictions Precautions Precautions: None    Mobility  Bed Mobility Overal bed mobility: Independent                  Transfers Overall transfer level: Independent               General transfer comment: Pt up and ambulating in room at arrival  Ambulation/Gait Ambulation/Gait assistance: Independent Gait Distance (Feet): 200 Feet Assistive device: None Gait Pattern/deviations: WFL(Within Functional Limits)     General Gait Details: ambulated in hall with normal gait   Stairs Stairs: Yes Stairs assistance: Supervision Stair Management: One rail Left;Alternating pattern Number of Stairs: 5 General stair comments: without difficulty   Wheelchair Mobility    Modified Rankin (Stroke Patients Only)       Balance Overall balance assessment: Independent                                          Cognition Arousal/Alertness: Awake/alert Behavior During Therapy: WFL for tasks assessed/performed Overall Cognitive Status: Within Functional Limits for tasks assessed                                         Exercises      General Comments General comments (skin integrity, edema, etc.): Pt reports dizzines is constant (laying, sitting, standing, walking). States started with her headache and contributes dizziness to headache. Started since admission.      Pertinent Vitals/Pain Pain Assessment: Faces Faces Pain Scale: Hurts even more Pain Location: headache Pain Intervention(s): Monitored during session;Repositioned;Relaxation    Home Living                      Prior Function            PT Goals (current goals can now be found in the care plan section) Acute Rehab PT Goals Patient Stated Goal: decrease HA, go home PT Goal Formulation: All assessment and education complete, DC therapy Progress towards PT goals: Goals met/education completed, patient discharged from PT (met or nearly met)    Frequency           PT Plan Current plan remains appropriate    Co-evaluation              AM-PAC PT "6 Clicks" Mobility   Outcome Measure  Help needed turning from your back to your side while in  a flat bed without using bedrails?: None Help needed moving from lying on your back to sitting on the side of a flat bed without using bedrails?: None Help needed moving to and from a bed to a chair (including a wheelchair)?: None Help needed standing up from a chair using your arms (e.g., wheelchair or bedside chair)?: None Help needed to walk in hospital room?: None Help needed climbing 3-5 steps with a railing? : A Little 6 Click Score: 23    End of Session   Activity Tolerance: Patient tolerated treatment well Patient left: in bed;with call bell/phone within reach Nurse Communication: Mobility status       Time: 0256-1548 PT Time Calculation (min) (ACUTE ONLY): 10 min  Charges:  $Gait Training: 8-22 mins                     Abran Richard, PT Acute Rehab Services Pager [phone] [facility] 08/23/2020, 11:55 AM

## 2020-08-23 NOTE — Discharge Summary (Addendum)
Name: Christie Holder MRN: [id number] DOB: [birth year] 50 y.o. PCP: Patient, [name]  Date of Admission: 08/20/2020 12:59 PM Date of Discharge: 08/23/2020 Attending Physician: Dr. Heide Spark  Discharge Diagnosis: 1. Sepsis secondary to acute pyelonephritis 2. Normocytic Anemia 3. Aortic atherosclerosis  Discharge Medications: Allergies as of 08/23/2020   No Known Allergies     Medication List    STOP taking these medications   nitrofurantoin 100 MG capsule Commonly known as: MACRODANTIN   oxyCODONE 5 MG immediate release tablet Commonly known as: Oxy IR/ROXICODONE   sulfamethoxazole-trimethoprim 800-160 MG tablet Commonly known as: BACTRIM DS     TAKE these medications   cefdinir 300 MG capsule Commonly known as: OMNICEF Take 1 capsule (300 mg total) by mouth 2 (two) times daily for 6 days.   HAIR SKIN AND NAILS FORMULA PO Take 1 tablet by mouth daily.   ibuprofen 600 MG tablet Commonly known as: ADVIL Take 1 tablet (600 mg total) by mouth every 6 (six) hours as needed for cramping. What changed: Another medication with the same name was removed. Continue taking this medication, and follow the directions you see here.   multivitamin tablet Take 1 tablet by mouth daily.   ondansetron 4 MG tablet Commonly known as: ZOFRAN Take 1 tablet (4 mg total) by mouth every 6 (six) hours as needed for nausea.       Disposition and follow-up:   Christie Holder was discharged from [facility] in Good condition.  At the hospital follow up visit please address:  1.   Please ensure completion of antibiotic course  2.  Labs / imaging needed at time of follow-up: N/A  3.  Pending labs/ test needing follow-up: N/A  Abx- cefdinir 300 mg capsule BID for 6 days, last date of abx - 08/29/20  Follow-up Appointments:   Hospital Course by problem list: 1. Sepsis secondary to acute pyelonephritis Christie Holder was recently admitted to [facility] after presenting with fever, chills, abdominal pain with dysuria.  On arrival, she was found to be febrile with tachycardia and elevated white blood count of 12.6.  Given that patient had been on treatment for UTI prior to arrival, UA was only remarkable for trace leukocytes and rare bacteria, although there was presence of hematuria and pyuria.  CT abdomen/pelvis was obtained that showed inflammation of the left ureter consistent with pyelonephritis.  She was started on ceftriaxone.  Unfortunately, urine culture was not able to be obtained due to lab error.  Blood cultures remain negative.  Leukocytosis resolved and upon clinical improvement, patient was narrowed from ceftriaxone to cefdinir with plan to complete a 10-day course.  2. Normocytic Anemia Patient's hemoglobin was noted to decrease from 13.0 to 10.7 during hospitalization, although this is in the setting of IV fluid resuscitation.  Per chart review, historically patient's hemoglobin is around 12 although she has a history of anemia in the past in 2013.  Iron panel was obtained that shows low iron levels and low iron saturation, however low normal TIBC is most consistent with anemia of chronic disease.  Ferritin was 220, although this may be elevated in the setting of acute infection.  At this time, recommend iron studies be repeated when patient is out of acute infectious state.  3.  Aortic atherosclerosis Noted on CT abdomen/pelvis that was obtained on admission.  A1c was evaluated and within normal limits at 5.1%.  Lipid panel was obtained; total cholesterol 134  with LDL of 80.  10-year ASCVD risk low at this time; no indication for statin therapy.  Discharge Exam:   BP 111/66 (BP Location: Left Arm)   Pulse 66   Temp 98.9 F (37.2 C) (Oral)   Resp 16   SpO2 99%    Discharge exam:  Physical Exam Vitals and nursing note reviewed.  Constitutional:      Appearance: She is overweight.  HENT:     Head: Normocephalic and  atraumatic.  Cardiovascular:     Rate and Rhythm: Normal rate and regular rhythm.     Heart sounds: No murmur heard. No gallop.   Pulmonary:     Effort: Pulmonary effort is normal. No respiratory distress.     Breath sounds: Normal breath sounds. No wheezing or rales.  Abdominal:     General: Bowel sounds are normal. There is no distension.     Palpations: Abdomen is soft.     Tenderness: There is no abdominal tenderness. There is no guarding or rebound.  Skin:    General: Skin is warm and dry.  Neurological:     General: No focal deficit present.     Mental Status: She is alert and oriented to person, place, and time. Mental status is at baseline.  Psychiatric:        Mood and Affect: Mood normal.        Behavior: Behavior normal.        Thought Content: Thought content normal.        Judgment: Judgment normal.    Pertinent Labs, Studies, and Procedures:  CBC Latest Ref Rng & Units 08/23/2020 08/22/2020 08/21/2020  WBC 4.0 - 10.5 K/uL 7.2 8.9 11.0(H)  Hemoglobin 12.0 - 15.0 g/dL 10.7(L) 10.5(L) 11.1(L)  Hematocrit 36.0 - 46.0 % 31.0(L) 30.7(L) 32.9(L)  Platelets 150 - 400 K/uL 229 194 185   CMP Latest Ref Rng & Units 08/23/2020 08/22/2020 08/21/2020  Glucose 70 - 99 mg/dL 329(J) 242(A) 834(H)  BUN 6 - 20 mg/dL 5(L) <9(Q) 7  Creatinine 0.44 - 1.00 mg/dL 2.22 9.79 8.92  Sodium 135 - 145 mmol/L 138 133(L) 133(L)  Potassium 3.5 - 5.1 mmol/L 3.6 3.7 3.9  Chloride 98 - 111 mmol/L 100 97(L) 101  CO2 22 - 32 mmol/L 28 30 25   Calcium 8.9 - 10.3 mg/dL 9.0 ) 1.1(H)  Total Protein 6.5 - 8.1 g/dL - - -  Total Bilirubin 0.3 - 1.2 mg/dL - - -  Alkaline Phos 38 - 126 U/L - - -  AST 15 - 41 U/L - - -  ALT 0 - 44 U/L - - -   CT A/P 1. No urinary tract calculi. However, there is haziness surrounding the left kidney and proximal third of the left ureter. This could be related to recently passed calculus, or could alternatively reflect upper tract urinary tract infection. Clinical  correlation for signs and symptoms of left-sided pyelonephritis is recommended. 2. Hepatic steatosis. 3. Large infraumbilical ventral hernia containing only omental fat. No associated bowel incarceration or obstruction at this time. 4. Aortic atherosclerosis.  Discharge Instructions: Discharge Instructions    Call MD for:  difficulty breathing, headache or visual disturbances   Complete by: As directed    Call MD for:  extreme fatigue   Complete by: As directed    Call MD for:  persistant dizziness or light-headedness   Complete by: As directed    Call MD for:  persistant nausea and vomiting   Complete by: As directed  Call MD for:  severe uncontrolled pain   Complete by: As directed    Call MD for:  temperature >100.4   Complete by: As directed    Discharge instructions   Complete by: As directed    Mrs. Gar Gibbon,   You were admitted to the hospital due to a kidney infection. While here, you were treated with intravenous antibiotics. At this time, we will transition to pill antibiotics. You are being sent home with two medications:   1. Cefdinir: This is an antibiotic for the kidney infection. Take 1 tablet twice per day for the next 6 days, starting tomorrow (08/24/2020)  2. Ondansetron: This is a medication to help with nausea. Take 1 tablet every 6 hours only if you are having nausea.   It was a pleasure meeting you and we wish you the best!   - Dr. Evie Lacks   --------------------------------------------------- Viona Gilmore. Gar Gibbon,  Ingres en el hospital debido a una infeccin renal. Mientras estuvo aqu, lo trataron con antibiticos intravenosos. En este momento, haremos la transicin a antibiticos en pastillas. Lo enviarn a casa con dos medicamentos:  1. Cefdinir: este es un antibitico para la infeccin renal. Tome 1 tableta dos veces al [employer] prximos 6 Angustura, a partir de [place].   2. Ondansetron: este es un medicamento para ayudar con las  nuseas. Tome 1 tableta cada 6 horas solo si tiene nuseas.  Fue un Dietitian y te deseamos lo mejor!  - Dr. Evie Lacks   Increase activity slowly   Complete by: As directed       Signed: Dr. Verdene Lennert Internal Medicine PGY-2  Pager: [phone] After 5pm on weekdays and 1pm on weekends: On Call pager [phone]  08/23/2020, 1:13 PM

## 2020-08-25 ENCOUNTER — Telehealth: Payer: Self-pay | Admitting: Student

## 2020-08-25 NOTE — Telephone Encounter (Signed)
NEW TOC HFU APPT PER DR Evie Lacks [facility] FOR 06/15/202 @ 2:15 WITH DR Marijo Conception

## 2020-08-26 LAB — CULTURE, BLOOD (ROUTINE X 2)
Culture: NO GROWTH
Culture: NO GROWTH
Special Requests: ADEQUATE
Special Requests: ADEQUATE

## 2020-09-05 ENCOUNTER — Encounter: Payer: Self-pay | Admitting: *Deleted

## 2020-09-05 ENCOUNTER — Ambulatory Visit: Payer: Self-pay | Admitting: Student

## 2020-10-02 ENCOUNTER — Inpatient Hospital Stay: Payer: Self-pay | Admitting: Internal Medicine

## 2021-01-26 DIAGNOSIS — N3001 Acute cystitis with hematuria: Secondary | ICD-10-CM | POA: Insufficient documentation

## 2021-01-26 DIAGNOSIS — N9489 Other specified conditions associated with female genital organs and menstrual cycle: Secondary | ICD-10-CM | POA: Insufficient documentation

## 2021-01-27 ENCOUNTER — Encounter (HOSPITAL_COMMUNITY): Payer: Self-pay

## 2021-01-27 ENCOUNTER — Emergency Department (HOSPITAL_COMMUNITY): Payer: Self-pay

## 2021-01-27 ENCOUNTER — Emergency Department (HOSPITAL_COMMUNITY)
Admission: EM | Admit: 2021-01-27 | Discharge: 2021-01-27 | Disposition: A | Discharge status: Home / Self Care | Payer: Self-pay | Attending: Emergency Medicine | Admitting: Emergency Medicine

## 2021-01-27 DIAGNOSIS — R1012 Left upper quadrant pain: Secondary | ICD-10-CM

## 2021-01-27 DIAGNOSIS — N3001 Acute cystitis with hematuria: Secondary | ICD-10-CM

## 2021-01-27 LAB — URINALYSIS, ROUTINE W REFLEX MICROSCOPIC
Bacteria, UA: NONE SEEN
Bilirubin Urine: NEGATIVE
Glucose, UA: NEGATIVE mg/dL
Ketones, ur: NEGATIVE mg/dL
Nitrite: POSITIVE — AB
Protein, ur: 30 mg/dL — AB
RBC / HPF: >50 RBC/hpf — ABNORMAL HIGH (ref 0–5)
Specific Gravity, Urine: 1.023 (ref 1.005–1.030)
WBC, UA: >50 WBC/hpf — ABNORMAL HIGH (ref 0–5)
pH: 5 (ref 5.0–8.0)

## 2021-01-27 LAB — COMPREHENSIVE METABOLIC PANEL
ALT: 37 U/L (ref 0–44)
AST: 33 U/L (ref 15–41)
Albumin: 3.7 g/dL (ref 3.5–5.0)
Alkaline Phosphatase: 93 U/L (ref 38–126)
Anion gap: 8 (ref 5–15)
BUN: 12 mg/dL (ref 6–20)
CO2: 23 mmol/L (ref 22–32)
Calcium: 8.7 mg/dL — ABNORMAL LOW (ref 8.9–10.3)
Chloride: 103 mmol/L (ref 98–111)
Creatinine, Ser: 0.68 mg/dL (ref 0.44–1.00)
GFR, Estimated: >60 mL/min (ref >60)
Glucose, Bld: 142 mg/dL — ABNORMAL HIGH (ref 70–99)
Potassium: 3.5 mmol/L (ref 3.5–5.1)
Sodium: 134 mmol/L — ABNORMAL LOW (ref 135–145)
Total Bilirubin: 0.3 mg/dL (ref 0.3–1.2)
Total Protein: 6.6 g/dL (ref 6.5–8.1)

## 2021-01-27 LAB — CBC WITH DIFFERENTIAL/PLATELET
Abs Immature Granulocytes: 0.06 10*3/uL (ref 0.00–0.07)
Basophils Absolute: 0.1 10*3/uL (ref 0.0–0.1)
Basophils Relative: 1 %
Eosinophils Absolute: 0.2 10*3/uL (ref 0.0–0.5)
Eosinophils Relative: 2 %
HCT: 35.2 % — ABNORMAL LOW (ref 36.0–46.0)
Hemoglobin: 12.5 g/dL (ref 12.0–15.0)
Immature Granulocytes: 1 %
Lymphocytes Relative: 38 %
Lymphs Abs: 4.2 10*3/uL — ABNORMAL HIGH (ref 0.7–4.0)
MCH: 33 pg (ref 26.0–34.0)
MCHC: 35.5 g/dL (ref 30.0–36.0)
MCV: 92.9 fL (ref 80.0–100.0)
Monocytes Absolute: 0.4 10*3/uL (ref 0.1–1.0)
Monocytes Relative: 4 %
Neutro Abs: 6.1 10*3/uL (ref 1.7–7.7)
Neutrophils Relative %: 54 %
Platelets: 243 10*3/uL (ref 150–400)
RBC: 3.79 MIL/uL — ABNORMAL LOW (ref 3.87–5.11)
RDW: 12.6 % (ref 11.5–15.5)
WBC: 11 10*3/uL — ABNORMAL HIGH (ref 4.0–10.5)
nRBC: 0 % (ref 0.0–0.2)

## 2021-01-27 LAB — I-STAT BETA HCG BLOOD, ED (MC, WL, AP ONLY): I-stat hCG, quantitative: <5 m[IU]/mL (ref <5)

## 2021-01-27 LAB — LIPASE, BLOOD: Lipase: 38 U/L (ref 11–51)

## 2021-01-27 MED ORDER — FAMOTIDINE 20 MG PO TABS
20.0000 mg | ORAL_TABLET | ORAL | Status: AC
  Administered 2021-01-27: 20 mg via ORAL
  Filled 2021-01-27: qty 1

## 2021-01-27 MED ORDER — OXYCODONE-ACETAMINOPHEN 5-325 MG PO TABS
1.0000 | ORAL_TABLET | Freq: Once | ORAL | Status: AC
  Administered 2021-01-27: 1 via ORAL
  Filled 2021-01-27: qty 1

## 2021-01-27 MED ORDER — CEPHALEXIN 500 MG PO CAPS: 500.0000 mg | ORAL_CAPSULE | Freq: Three times a day (TID) | ORAL | 0 refills | Status: AC

## 2021-01-27 MED ORDER — FAMOTIDINE 40 MG PO TABS: 40.0000 mg | ORAL_TABLET | Freq: Every day | ORAL | 0 refills | Status: DC

## 2021-01-27 MED ORDER — CEPHALEXIN 250 MG PO CAPS
500.0000 mg | ORAL_CAPSULE | Freq: Once | ORAL | Status: AC
  Administered 2021-01-27: 500 mg via ORAL
  Filled 2021-01-27: qty 2

## 2021-01-27 MED ORDER — ONDANSETRON 4 MG PO TBDP
4.0000 mg | ORAL_TABLET | Freq: Once | ORAL | Status: AC
  Administered 2021-01-27: 4 mg via ORAL
  Filled 2021-01-27: qty 1

## 2021-01-27 MED ORDER — ONDANSETRON 4 MG PO TBDP: 4.0000 mg | ORAL_TABLET | Freq: Three times a day (TID) | ORAL | 0 refills | Status: DC | PRN

## 2021-01-27 NOTE — ED Provider Notes (Signed)
[facility] EMERGENCY DEPARTMENT Provider Note   CSN: [id number] Arrival date & time: 01/26/21  2355     History Chief Complaint  Patient presents with   Abdominal Pain    Christie Holder is a 50 y.o. female.  HPI Patient is a 50 year old female with past medical history significant for gestational diabetes, hyperglycemia, cholelithiasis, obesity, pyelonephritis.  Patient presented to the ER today complaining of left upper quadrant abdominal pain she states it is achy occasionally stabbing left upper quadrant abdominal pain.  She denies any alcohol use of note denies any recreational drug use.  She endorses some nausea and vomiting states that she has not vomited at all today states that is been nonbloody nonbilious.  She states that her pain is moderate currently does not seem to be radiating although it has occasionally radiated to her back.  She states that it is constant does not seem to have any aggravating or mitigating factors apart from somewhat worse with movement occasionally.  Also endorses some urinary frequency some dysuria and some blood in urine.  She has not had any BRBPR or diarrhea.  Denies any fevers denies any vaginal irritation or pain or dyspareunia.    Past Medical History:  Diagnosis Date   Gestational diabetes 10/10/2011    Patient Active Problem List   Diagnosis Date Noted   Sepsis without acute organ dysfunction ([place])    Hematuria 08/20/2020   Aortic atherosclerosis ([place]) 08/20/2020   Hyperglycemia 08/20/2020   Hepatic steatosis [id number]   Complicated UTI (urinary tract infection) 08/20/2020   Cholelithiasis 10/02/2016   Symptomatic cholelithiasis 10/01/2016   Complete miscarriage 06/23/2013   History of macrosomia in infant in prior pregnancy, currently pregnant 12/02/2011   Pyelonephritis of left kidney 10/22/2011   Obesity 07/04/2011    Past Surgical History:  Procedure Laterality Date   CESAREAN SECTION  12/19/2011    Procedure: CESAREAN SECTION;  Surgeon: Lavonia Drafts, MD;  Location: [facility];  Service: Obstetrics;  Laterality: N/A;   CHOLECYSTECTOMY N/A 10/01/2016   Procedure: LAPAROSCOPIC CHOLECYSTECTOMY;  Surgeon: Georganna Skeans, MD;  Location: [place];  Service: General;  Laterality: N/A;     OB History     Gravida  8   Para  7   Term  7   Preterm      AB  1   Living  7      SAB  1   IAB      Ectopic      Multiple      Live Births  7           Family History  Problem Relation Age of Onset   Diabetes Maternal Grandmother     Social History   Tobacco Use   Smoking status: Never   Smokeless tobacco: Never  Vaping Use   Vaping Use: Never used  Substance Use Topics   Alcohol use: No   Drug use: No    Home Medications Prior to Admission medications   Medication Sig Start Date End Date Taking? Authorizing Provider  cephALEXin (KEFLEX) 500 MG capsule Take 1 capsule (500 mg total) by mouth 3 (three) times daily for 5 days. 01/27/21 02/01/21 Yes Annalycia Done S, PA  famotidine (PEPCID) 40 MG tablet Take 1 tablet (40 mg total) by mouth daily. 01/27/21  Yes Emelio Schneller S, PA  ondansetron (ZOFRAN ODT) 4 MG disintegrating tablet Take 1 tablet (4 mg total) by mouth every 8 (eight) hours as needed for nausea  or vomiting. 01/27/21  Yes Nhung Danko S, PA  ibuprofen (ADVIL,MOTRIN) 600 MG tablet Take 1 tablet (600 mg total) by mouth every 6 (six) hours as needed for cramping. Patient not taking: No sig reported 07/16/13   Tamala Julian, [place], CNM  Multiple Vitamin (MULTIVITAMIN) tablet Take 1 tablet by mouth daily.    [provider]  Multiple Vitamins-Minerals (HAIR SKIN AND NAILS FORMULA PO) Take 1 tablet by mouth daily.    [provider]  ondansetron (ZOFRAN) 4 MG tablet Take 1 tablet (4 mg total) by mouth every 6 (six) hours as needed for nausea. 08/23/20   Jose Persia, MD    Allergies    Patient has no known allergies.  Review of Systems    Review of Systems  Constitutional:  Negative for chills and fever.  HENT:  Negative for congestion.   Eyes:  Negative for pain.  Respiratory:  Negative for cough and shortness of breath.   Cardiovascular:  Negative for chest pain and leg swelling.  Gastrointestinal:  Positive for abdominal pain, nausea and vomiting. Negative for diarrhea.  Genitourinary:  Positive for dysuria.  Musculoskeletal:  Negative for myalgias.  Skin:  Negative for rash.  Neurological:  Negative for dizziness and headaches.   Physical Exam Updated Vital Signs BP (!) 100/58   Pulse (!) 50   Temp 98 F (36.7 C)   Resp 16   SpO2 98%   Physical Exam Vitals and nursing note reviewed.  Constitutional:      General: She is not in acute distress. HENT:     Head: Normocephalic and atraumatic.     Nose: Nose normal.  Eyes:     General: No scleral icterus. Cardiovascular:     Rate and Rhythm: Normal rate and regular rhythm.     Pulses: Normal pulses.     Heart sounds: Normal heart sounds.  Pulmonary:     Effort: Pulmonary effort is normal. No respiratory distress.     Breath sounds: No wheezing.  Abdominal:     Palpations: Abdomen is soft.     Tenderness: There is abdominal tenderness. There is left CVA tenderness.     Comments: Sound when, mildly tender to palpation in the left lower left mid and left upper abdomen.  No guarding rebound and no rigidity.  Abdomen is soft and tenderness is only elicited with deep palpation.  No bruising.  Genitourinary:    Comments: Deferred Musculoskeletal:     Cervical back: Normal range of motion.     Right lower leg: No edema.     Left lower leg: No edema.  Skin:    General: Skin is warm and dry.     Capillary Refill: Capillary refill takes less than 2 seconds.  Neurological:     Mental Status: She is alert. Mental status is at baseline.  Psychiatric:        Mood and Affect: Mood normal.        Behavior: Behavior normal.    ED Results / Procedures /  Treatments   Labs (all labs ordered are listed, but only abnormal results are displayed) Labs Reviewed  COMPREHENSIVE METABOLIC PANEL - Abnormal; Notable for the following components:      Result Value   Sodium 134 (*)    Glucose, Bld 142 (*)    Calcium 8.7 (*)    All other components within normal limits  CBC WITH DIFFERENTIAL/PLATELET - Abnormal; Notable for the following components:   WBC 11.0 (*)    RBC  3.79 (*)    HCT 35.2 (*)    Lymphs Abs 4.2 (*)    All other components within normal limits  URINALYSIS, ROUTINE W REFLEX MICROSCOPIC - Abnormal; Notable for the following components:   Color, Urine AMBER (*)    APPearance CLOUDY (*)    Hgb urine dipstick LARGE (*)    Protein, ur 30 (*)    Nitrite POSITIVE (*)    Leukocytes,Ua MODERATE (*)    RBC / HPF >50 (*)    WBC, UA >50 (*)    All other components within normal limits  URINE CULTURE  LIPASE, BLOOD  I-STAT BETA HCG BLOOD, ED ([facility], [facility], [facility] ONLY)    EKG None  Radiology CT Renal Stone Study  Result Date: 01/27/2021 CLINICAL DATA:  Flank pain EXAM: CT ABDOMEN AND PELVIS WITHOUT CONTRAST TECHNIQUE: Multidetector CT imaging of the abdomen and pelvis was performed following the standard protocol without IV contrast. COMPARISON:  08/20/2020 FINDINGS: LOWER CHEST: Normal. HEPATOBILIARY: Normal hepatic contours. No intra- or extrahepatic biliary dilatation. The gallbladder is normal. PANCREAS: Normal pancreas. No ductal dilatation or peripancreatic fluid collection. SPLEEN: Normal. ADRENALS/URINARY TRACT: The adrenal glands are normal. No hydronephrosis, nephroureterolithiasis or solid renal mass. The urinary bladder is normal for degree of distention STOMACH/BOWEL: There is no hiatal hernia. Normal duodenal course and caliber. No small bowel dilatation or inflammation. No focal colonic abnormality. Normal appendix. VASCULAR/LYMPHATIC: Normal course and caliber of the major abdominal vessels. No abdominal or pelvic lymphadenopathy.  REPRODUCTIVE: Normal uterus. No adnexal mass. MUSCULOSKELETAL. No bony spinal canal stenosis or focal osseous abnormality. OTHER: Unchanged fat containing ventral abdominal hernia. IMPRESSION: 1. No acute abnormality of the abdomen or pelvis. No obstructive uropathy or nephrolithiasis. 2. Unchanged fat containing ventral abdominal hernia. Electronically Signed   By: Deatra Robinson M.D.   On: 01/27/2021 01:38    Procedures Procedures   Medications Ordered in ED Medications  cephALEXin (KEFLEX) capsule 500 mg (500 mg Oral Given 01/27/21 0813)  ondansetron (ZOFRAN-ODT) disintegrating tablet 4 mg (4 mg Oral Given 01/27/21 0814)  famotidine (PEPCID) tablet 20 mg (20 mg Oral Given 01/27/21 0814)  oxyCODONE-acetaminophen (PERCOCET/ROXICET) 5-325 MG per tablet 1 tablet (1 tablet Oral Given 01/27/21 0813)    ED Course  I have reviewed the triage vital signs and the nursing notes.  Pertinent labs & imaging results that were available during my care of the patient were reviewed by me and considered in my medical decision making (see chart for details).    MDM Rules/Calculators/A&P                          Patient is 50 year old female presented to the ER today with ongoing symptoms of left upper quadrant abdominal pain also seems to be having some dysuria frequency and states that she feels that she has UTI  Physical exam is notable for mild tenderness to palpation of the entire left half of the abdomen this is mild tenderness with deep palpation.  She is not peritoneal neck she is overall well-appearing no tachycardia hypertension or hypotension or tachypnea.  She is afebrile.  Urinalysis with hematuria positive for nitrates and leukocytes interestingly there was no bacteria seen in urine will obtain culture and recommend follow-up with PCP CBC with very mild leukocytosis no significant anemia.  CMP unremarkable.  Lipase within normal limits i-STAT hCG negative pregnancy.  CT renal stone study was  negative for obstructive uropathy.  Does have some nonobstructive stones and  it is possible that patient has already passed kidney stones.  However there is no stranding or dilation or hydronephrosis evidence of this.  Patient given Keflex Zofran and Pepcid as well as 1 tablet of Percocet.  Her symptoms are somewhat concerning for gastritis given left upper quadrant pain that is sharp and stabbing.  She denies any alcohol use of note and her exam is overall reassuring CT renal stone study unremarkable doubt patient has diverticulitis and no history of similar.  She feels better after Pepcid Keflex and Zofran and Percocet.  Will discharge home with Keflex Zofran and Pepcid.  She will follow-up with PCP and gastroenterologist.  Return precautions were given.  Tolerating p.o. prior to discharge and ambulatory without difficulty.  Final Clinical Impression(s) / ED Diagnoses Final diagnoses:  Acute cystitis with hematuria  Left upper quadrant abdominal pain    Rx / DC Orders ED Discharge Orders          Ordered    cephALEXin (KEFLEX) 500 MG capsule  3 times daily        01/27/21 0857    ondansetron (ZOFRAN ODT) 4 MG disintegrating tablet  Every 8 hours PRN        01/27/21 0857    famotidine (PEPCID) 40 MG tablet  Daily        01/27/21 0857             Tedd Sias, PA 01/27/21 [place], [place], DO 01/27/21 1541

## 2021-01-27 NOTE — ED Provider Notes (Signed)
Emergency Medicine Provider Triage Evaluation Note  Christie Holder , a 50 y.o. female  was evaluated in triage.  Pt complains of abdominal pain that began earlier today. Left sided into the left back, constant, no alleviating/aggravating factors. Having associated nausea, vomiting, dysuria, & hematuria. Denies diarrhea.   Review of Systems  Positive: N/V, abdominal pain, dysuria, hematuria.  Negative: Diarrhea, syncope.   Physical Exam  BP (!) 108/57   Pulse 66   Temp 98 F (36.7 C)   Resp 16   SpO2 100%  Gen:   Awake, no distress   Resp:  Normal effort  MSK:   Moves extremities without difficulty  Other:  L CVA and Left sided abdominal tenderness. No peritoneal signs.   Medical Decision Making  Medically screening exam initiated at 12:08 AM.  Appropriate orders placed.  Christie Holder was informed that the remainder of the evaluation will be completed by another provider, this initial triage assessment does not replace that evaluation, and the importance of remaining in the ED until their evaluation is complete.  Abdominal pain.    Christie Holder 01/27/21 0010    Christie Octave, MD 01/27/21 [phone]

## 2021-01-27 NOTE — Discharge Instructions (Addendum)
Please follow-up with your primary care doctor.  Please discuss with them your symptoms.  You do have blood in your urine which may be due to a urinary tract infection however you will need to have your urine rechecked to make sure that the blood in your urine resolves.  You may take Tylenol 1000 mg every 6 hours for pain.  I recommend refraining from ibuprofen Aleve or naproxen.  Please take the Pepcid as prescribed.  May always return to the ER for any new or concerning symptoms.

## 2021-01-27 NOTE — ED Triage Notes (Signed)
Pt reports LUQ abd pain today with dysuria, hematuria, and nausea

## 2021-01-29 LAB — URINE CULTURE: Culture: >=100000 — AB

## 2021-02-01 ENCOUNTER — Ambulatory Visit: Payer: Self-pay

## 2021-02-01 NOTE — Telephone Encounter (Signed)
Pt's daughter [place] called in for pt, verified information of mother and translates as well. Pt is c/o of abdominal pain 6-7/10 and says she can palpate a mass in her lower quadrant. Says the pain radiates into the hip and back on the right side. Earlier today, pt had EMS come out d/t tingling in arms and vomited x 1 and had increased HR. EMS checked pt out and told her she had low BP but didn't advise ED. Daughter is wanting to know what pt needs to do. Advised per symptoms and no PCP, pt would need to go to [facility] or ED. Daughter asked about scheduling new pt appt somewhere. Appt was made for [facility] with Zelda on 02/21/21 at 1350. Advised daughter to bring ID and insurance info. Asked which ED they would be taking mom to and she said [facility] ED. Care advice given and pt verbalized understanding. No other questions/concerns noted.     Reason for Disposition  Patient sounds very sick or weak to the triager  Answer Assessment - Initial Assessment Questions 1. LOCATION: "Where does it hurt?"      Lower quadrant, left and right 2. RADIATION: "Does the pain shoot anywhere else?" (e.g., chest, back)     Goes into hip and back, right side 3. ONSET: "When did the pain begin?" (e.g., minutes, hours or days ago)      3 days 4. SUDDEN: "Gradual or sudden onset?"     Gradually came on 5. PATTERN "Does the pain come and go, or is it constant?"    - If constant: "Is it getting better, staying the same, or worsening?"      (Note: Constant means the pain never goes away completely; most serious pain is constant and it progresses)     - If intermittent: "How long does it last?" "Do you have pain now?"     (Note: Intermittent means the pain goes away completely between bouts)     Comes and go  6. SEVERITY: "How bad is the pain?"  (e.g., Scale 1-10; mild, moderate, or severe)   - MILD (1-3): doesn't interfere with normal activities, abdomen soft and not tender to touch    - MODERATE (4-7): interferes with normal  activities or awakens from sleep, abdomen tender to touch    - SEVERE (8-10): excruciating pain, doubled over, unable to do any normal activities      6-7 7. RECURRENT SYMPTOM: "Have you ever had this type of stomach pain before?" If Yes, ask: "When was the last time?" and "What happened that time?"      NO 8. CAUSE: "What do you think is causing the stomach pain?"     unsure 9. RELIEVING/AGGRAVATING FACTORS: "What makes it better or worse?" (e.g., movement, antacids, bowel movement)     Eating and using bathroom makes it worse 10. OTHER SYMPTOMS: "Do you have any other symptoms?" (e.g., back pain, diarrhea, fever, urination pain, vomiting)       Can feel a mass in stomach 11. PREGNANCY: "Is there any chance you are pregnant?" "When was your last menstrual period?"       No, LMP April 2022  Protocols used: Abdominal Pain - [facility]

## 2021-02-21 ENCOUNTER — Other Ambulatory Visit: Payer: Self-pay

## 2021-02-21 ENCOUNTER — Ambulatory Visit: Payer: MEDICAID | Attending: Nurse Practitioner | Admitting: Nurse Practitioner

## 2021-02-21 ENCOUNTER — Encounter: Payer: Self-pay | Admitting: Nurse Practitioner

## 2021-02-21 VITALS — BP 119/79 | HR 65 | Ht 62.0 in | Wt 234.1 lb

## 2021-02-21 DIAGNOSIS — G629 Polyneuropathy, unspecified: Secondary | ICD-10-CM

## 2021-02-21 DIAGNOSIS — D72829 Elevated white blood cell count, unspecified: Secondary | ICD-10-CM

## 2021-02-21 DIAGNOSIS — R635 Abnormal weight gain: Secondary | ICD-10-CM

## 2021-02-21 DIAGNOSIS — Z7689 Persons encountering health services in other specified circumstances: Secondary | ICD-10-CM

## 2021-02-21 DIAGNOSIS — Z1231 Encounter for screening mammogram for malignant neoplasm of breast: Secondary | ICD-10-CM

## 2021-02-21 DIAGNOSIS — N926 Irregular menstruation, unspecified: Secondary | ICD-10-CM

## 2021-02-21 DIAGNOSIS — Z1211 Encounter for screening for malignant neoplasm of colon: Secondary | ICD-10-CM

## 2021-02-21 DIAGNOSIS — Z23 Encounter for immunization: Secondary | ICD-10-CM

## 2021-02-21 LAB — POCT URINE PREGNANCY: Preg Test, Ur: NEGATIVE

## 2021-02-21 MED ORDER — GABAPENTIN 300 MG PO CAPS
300.0000 mg | ORAL_CAPSULE | Freq: Every day | ORAL | 1 refills | Status: DC
  Filled 2021-02-21: qty 30, 30d supply, fill #0

## 2021-02-21 NOTE — Progress Notes (Signed)
Assessment & Plan:  [name] was seen today for establish care.  Diagnoses and all orders for this visit:  Encounter to establish care  Neuropathy -     CBC with Differential -     VITAMIN D 25 Hydroxy (Vit-D Deficiency, Fractures) -     Vitamin B12 -     gabapentin (NEURONTIN) 300 MG capsule; Take 1 capsule (300 mg total) by mouth at bedtime.  Breast cancer screening by mammogram -     MM 3D SCREEN BREAST BILATERAL; Future  Colon cancer screening -     Ambulatory referral to Gastroenterology  Late period -     POCT urine pregnancy  Weight gain -     Thyroid Panel With TSH  Leukocytosis, unspecified type -     CBC with Differential  Need for influenza vaccination -     Flu Vaccine QUAD 34mo+IM (Fluarix, Fluzone & Alfiuria Quad PF)   Patient has been counseled on age-appropriate routine health concerns for screening and prevention. These are reviewed and up-to-date. Referrals have been placed accordingly. Immunizations are up-to-date or declined.    Subjective:   Chief Complaint  Patient presents with   Establish Care   HPI Christie Holder 50 y.o. female presents to office today to establish care.  She has a past medical history of Gestational diabetes (10/10/2011).   She is accompanied by an onsite interpreter today.   Patient has been counseled on age-appropriate routine health concerns for screening and prevention. These are reviewed and up-to-date. Referrals have been placed accordingly. Immunizations are up-to-date or declined.    MAMMOGRAM: Referral Placed today GASTROENTEROLOGY: Referral placed today PAP SMEAR: will need to schedule for next visit.   She has been experiencing irregular periods over the past 4 years. Last menstrual cycles was 7 months ago.    Neuropathy: She describes symptoms of numbness and tingling in the right hand and right foot. Onset of symptoms was  2 months ago . Symptoms are currently of moderate severity. Symptoms occur  intermittently and last hours. The patient denies burning, lancinating pain, cramping, squeezing, and hypersensitivity. Previous treatment has included NONE.   Review of Systems  Constitutional:  Negative for fever, malaise/fatigue and weight loss.       WEIGHT GAIN  HENT: Negative.  Negative for nosebleeds.   Eyes: Negative.  Negative for blurred vision, double vision and photophobia.  Respiratory: Negative.  Negative for cough and shortness of breath.   Cardiovascular: Negative.  Negative for chest pain, palpitations and leg swelling.  Gastrointestinal: Negative.  Negative for heartburn, nausea and vomiting.  Musculoskeletal: Negative.  Negative for myalgias.  Neurological:  Positive for tingling. Negative for dizziness, focal weakness, seizures and headaches.       NEUROPATHY  Psychiatric/Behavioral: Negative.  Negative for suicidal ideas.    Past Medical History:  Diagnosis Date   Gestational diabetes 10/10/2011    Past Surgical History:  Procedure Laterality Date   CESAREAN SECTION  12/19/2011   Procedure: CESAREAN SECTION;  Surgeon: Willodean Rosenthal, MD;  Location: [facility];  Service: Obstetrics;  Laterality: N/A;   CHOLECYSTECTOMY N/A 10/01/2016   Procedure: LAPAROSCOPIC CHOLECYSTECTOMY;  Surgeon: Violeta Gelinas, MD;  Location: [facility] OR;  Service: General;  Laterality: N/A;    Family History  Problem Relation Age of Onset   Diabetes Maternal Grandmother     Social History Reviewed with no changes to be made today.   Outpatient Medications Prior to Visit  Medication Sig Dispense Refill  ibuprofen (ADVIL,MOTRIN) 600 MG tablet Take 1 tablet (600 mg total) by mouth every 6 (six) hours as needed for cramping. 30 tablet 1   Multiple Vitamin (MULTIVITAMIN) tablet Take 1 tablet by mouth daily.     Multiple Vitamins-Minerals (HAIR SKIN AND NAILS FORMULA PO) Take 1 tablet by mouth daily.     famotidine (PEPCID) 40 MG tablet Take 1 tablet (40 mg total) by mouth daily. (Patient not  taking: Reported on 02/21/2021) 14 tablet 0   ondansetron (ZOFRAN ODT) 4 MG disintegrating tablet Take 1 tablet (4 mg total) by mouth every 8 (eight) hours as needed for nausea or vomiting. (Patient not taking: Reported on 02/21/2021) 20 tablet 0   ondansetron (ZOFRAN) 4 MG tablet Take 1 tablet (4 mg total) by mouth every 6 (six) hours as needed for nausea. (Patient not taking: Reported on 02/21/2021) 20 tablet 0   No facility-administered medications prior to visit.    No Known Allergies     Objective:    BP 119/79   Pulse 65   Ht 5\' 2"  (1.575 m)   Wt 234 lb 2 oz (106.2 kg)   LMP 08/01/2020   SpO2 96%   BMI 42.82 kg/m  Wt Readings from Last 3 Encounters:  02/21/21 234 lb 2 oz (106.2 kg)  09/26/18 190 lb (86.2 kg)  10/01/16 253 lb (114.8 kg)    Physical Exam Vitals and nursing note reviewed.  Constitutional:      Appearance: She is well-developed.  HENT:     Head: Normocephalic and atraumatic.  Cardiovascular:     Rate and Rhythm: Normal rate and regular rhythm.     Heart sounds: Normal heart sounds. No murmur heard.   No friction rub. No gallop.  Pulmonary:     Effort: Pulmonary effort is normal. No tachypnea or respiratory distress.     Breath sounds: Normal breath sounds. No decreased breath sounds, wheezing, rhonchi or rales.  Chest:     Chest wall: No tenderness.  Abdominal:     General: Bowel sounds are normal.     Palpations: Abdomen is soft.  Musculoskeletal:        General: Normal range of motion.     Cervical back: Normal range of motion.  Skin:    General: Skin is warm and dry.  Neurological:     Mental Status: She is alert and oriented to person, place, and time.     Coordination: Coordination normal.  Psychiatric:        Behavior: Behavior normal. Behavior is cooperative.        Thought Content: Thought content normal.        Judgment: Judgment normal.         Patient has been counseled extensively about nutrition and exercise as well as the  importance of adherence with medications and regular follow-up. The patient was given clear instructions to go to ER or return to medical center if symptoms don't improve, worsen or new problems develop. The patient verbalized understanding.   Follow-up: Return for PAP SMEAR.   12/02/16, FNP-BC [facility] [place], [place]   02/21/2021, 4:16 PM

## 2021-02-22 LAB — CBC WITH DIFFERENTIAL/PLATELET
Basophils Absolute: 0 10*3/uL (ref 0.0–0.2)
Basos: 0 %
EOS (ABSOLUTE): 0.1 10*3/uL (ref 0.0–0.4)
Eos: 1 %
Hematocrit: 39.6 % (ref 34.0–46.6)
Hemoglobin: 13.4 g/dL (ref 11.1–15.9)
Immature Grans (Abs): 0.1 10*3/uL (ref 0.0–0.1)
Immature Granulocytes: 1 %
Lymphocytes Absolute: 4.1 10*3/uL — ABNORMAL HIGH (ref 0.7–3.1)
Lymphs: 42 %
MCH: 32 pg (ref 26.6–33.0)
MCHC: 33.8 g/dL (ref 31.5–35.7)
MCV: 95 fL (ref 79–97)
Monocytes Absolute: 0.5 10*3/uL (ref 0.1–0.9)
Monocytes: 5 %
Neutrophils Absolute: 5.1 10*3/uL (ref 1.4–7.0)
Neutrophils: 51 %
Platelets: 273 10*3/uL (ref 150–450)
RBC: 4.19 x10E6/uL (ref 3.77–5.28)
RDW: 12.7 % (ref 11.7–15.4)
WBC: 9.9 10*3/uL (ref 3.4–10.8)

## 2021-02-22 LAB — THYROID PANEL WITH TSH
Free Thyroxine Index: 2.3 (ref 1.2–4.9)
T3 Uptake Ratio: 28 % (ref 24–39)
T4, Total: 8.3 ug/dL (ref 4.5–12.0)
TSH: 1.44 u[IU]/mL (ref 0.450–4.500)

## 2021-02-22 LAB — VITAMIN B12: Vitamin B-12: 1403 pg/mL — ABNORMAL HIGH (ref 232–1245)

## 2021-02-22 LAB — VITAMIN D 25 HYDROXY (VIT D DEFICIENCY, FRACTURES): Vit D, 25-Hydroxy: 31.2 ng/mL (ref 30.0–100.0)

## 2021-02-23 ENCOUNTER — Telehealth: Payer: Self-pay

## 2021-02-23 NOTE — Telephone Encounter (Signed)
Vm is full

## 2021-02-23 NOTE — Telephone Encounter (Signed)
-----   Message from Claiborne Rigg, NP sent at 02/22/2021  7:40 PM EST ----- Thyroid level is normal CBC does not indicate any anemia or bleeding disorders  Vit D is normal. Vitamin B12 is slightly elevated but does not require any additional testing at this time.  Negative urine pregnancy test

## 2021-04-03 ENCOUNTER — Encounter: Payer: Self-pay | Admitting: Family Medicine

## 2021-04-04 ENCOUNTER — Encounter: Payer: Self-pay | Admitting: Obstetrics and Gynecology

## 2021-04-04 ENCOUNTER — Ambulatory Visit: Payer: Self-pay

## 2021-04-04 NOTE — Progress Notes (Signed)
Patient did not keep her GYN referral appointment for 04/04/2021.  Cornelia Copa MD Attending Center for [employer] [facility])

## 2021-04-06 ENCOUNTER — Ambulatory Visit: Payer: Self-pay | Admitting: Nurse Practitioner

## 2021-04-19 ENCOUNTER — Encounter: Payer: Self-pay | Admitting: Nurse Practitioner

## 2021-06-18 ENCOUNTER — Other Ambulatory Visit: Payer: Self-pay

## 2021-06-18 ENCOUNTER — Emergency Department (HOSPITAL_COMMUNITY): Payer: Commercial Managed Care - HMO

## 2021-06-18 ENCOUNTER — Emergency Department (HOSPITAL_COMMUNITY)
Admission: EM | Admit: 2021-06-18 | Discharge: 2021-06-19 | Disposition: A | Discharge status: Home / Self Care | Payer: Commercial Managed Care - HMO | Attending: Emergency Medicine | Admitting: Emergency Medicine

## 2021-06-18 ENCOUNTER — Encounter (HOSPITAL_COMMUNITY): Payer: Self-pay | Admitting: Emergency Medicine

## 2021-06-18 DIAGNOSIS — R202 Paresthesia of skin: Secondary | ICD-10-CM | POA: Insufficient documentation

## 2021-06-18 DIAGNOSIS — M79604 Pain in right leg: Secondary | ICD-10-CM | POA: Diagnosis not present

## 2021-06-18 DIAGNOSIS — E876 Hypokalemia: Secondary | ICD-10-CM | POA: Diagnosis not present

## 2021-06-18 LAB — DIFFERENTIAL
Abs Immature Granulocytes: 0.05 10*3/uL (ref 0.00–0.07)
Basophils Absolute: 0 10*3/uL (ref 0.0–0.1)
Basophils Relative: 1 %
Eosinophils Absolute: 0.2 10*3/uL (ref 0.0–0.5)
Eosinophils Relative: 3 %
Immature Granulocytes: 1 %
Lymphocytes Relative: 36 %
Lymphs Abs: 3.2 10*3/uL (ref 0.7–4.0)
Monocytes Absolute: 0.4 10*3/uL (ref 0.1–1.0)
Monocytes Relative: 5 %
Neutro Abs: 4.9 10*3/uL (ref 1.7–7.7)
Neutrophils Relative %: 54 %

## 2021-06-18 LAB — CBC
HCT: 38.1 % (ref 36.0–46.0)
Hemoglobin: 13.5 g/dL (ref 12.0–15.0)
MCH: 32.8 pg (ref 26.0–34.0)
MCHC: 35.4 g/dL (ref 30.0–36.0)
MCV: 92.7 fL (ref 80.0–100.0)
Platelets: 244 10*3/uL (ref 150–400)
RBC: 4.11 MIL/uL (ref 3.87–5.11)
RDW: 12.4 % (ref 11.5–15.5)
WBC: 8.8 10*3/uL (ref 4.0–10.5)
nRBC: 0 % (ref 0.0–0.2)

## 2021-06-18 LAB — COMPREHENSIVE METABOLIC PANEL
ALT: 24 U/L (ref 0–44)
AST: 24 U/L (ref 15–41)
Albumin: 3.9 g/dL (ref 3.5–5.0)
Alkaline Phosphatase: 81 U/L (ref 38–126)
Anion gap: 7 (ref 5–15)
BUN: 11 mg/dL (ref 6–20)
CO2: 27 mmol/L (ref 22–32)
Calcium: 9.4 mg/dL (ref 8.9–10.3)
Chloride: 105 mmol/L (ref 98–111)
Creatinine, Ser: 0.77 mg/dL (ref 0.44–1.00)
GFR, Estimated: >60 mL/min (ref >60)
Glucose, Bld: 121 mg/dL — ABNORMAL HIGH (ref 70–99)
Potassium: 3.4 mmol/L — ABNORMAL LOW (ref 3.5–5.1)
Sodium: 139 mmol/L (ref 135–145)
Total Bilirubin: 0.7 mg/dL (ref 0.3–1.2)
Total Protein: 7.4 g/dL (ref 6.5–8.1)

## 2021-06-18 LAB — APTT: aPTT: 27 seconds (ref 24–36)

## 2021-06-18 LAB — PROTIME-INR
INR: 1 (ref 0.8–1.2)
Prothrombin Time: 12.9 seconds (ref 11.4–15.2)

## 2021-06-18 NOTE — ED Triage Notes (Signed)
Patient coming from home, complaint of numbness and tingling, headache, and slurred speech for 3 days. Patient reports mild symptoms for 2 weeks but symptoms have gotten worse the last 3 days. ?

## 2021-06-18 NOTE — ED Provider Triage Note (Signed)
Emergency Medicine Provider Triage Evaluation Note ? ?Christie Holder , a 51 y.o. female  was evaluated in triage.  Pt complains of slurred speech, numbness, headache, visual disturbance.  Patient has been dealing with the symptoms over the last 2 weeks.  Symptoms have gotten progressively worse over the last 3 days. ? ?Patient is having blurred vision and double vision to right eye.  Patient is having numbness to bilateral upper and lower extremities however more noticeable on the right side.  Patient reports that pain starts in her neck and radiates throughout her entire head. ? ?Review of Systems  ?Positive: Headache, numbness, weakness, slurred speech, visual disturbance, diplopia ?Negative: Facial asymmetry ? ?Physical Exam  ?BP 138/80 (BP Location: Left Arm)   Pulse 79   Temp 98.5 ?F (36.9 ?C) (Oral)   Resp 15   SpO2 95%  ?Gen:   Awake, no distress   ?Resp:  Normal effort  ?MSK:   Moves extremities without difficulty  ?Other:  +5 strength to bilateral upper and lower extremities.  Grip strength equal.  No facial asymmetry.  EOM intact bilaterally.  Pupils PERRL. ? ?Medical Decision Making  ?Medically screening exam initiated at 6:17 PM.  Appropriate orders placed.  Christie Holder was informed that the remainder of the evaluation will be completed by another provider, this initial triage assessment does not replace that evaluation, and the importance of remaining in the ED until their evaluation is complete. ? ?Patient was offered translator however requested family member translate at bedside.   ? ?Patient is outside the stroke window.  We will obtain CT head at this time as well as stroke panel labs. ?  ?Haskel Schroeder, PA-C ?06/18/21 1824 ? ?

## 2021-06-19 ENCOUNTER — Emergency Department (HOSPITAL_COMMUNITY): Payer: Commercial Managed Care - HMO

## 2021-06-19 ENCOUNTER — Encounter: Payer: Self-pay | Admitting: Neurology

## 2021-06-19 MED ORDER — KETOROLAC TROMETHAMINE 30 MG/ML IJ SOLN
30.0000 mg | Freq: Once | INTRAMUSCULAR | Status: AC
  Administered 2021-06-19: 30 mg via INTRAVENOUS
  Filled 2021-06-19: qty 1

## 2021-06-19 MED ORDER — GADOBUTROL 1 MMOL/ML IV SOLN
10.0000 mL | Freq: Once | INTRAVENOUS | Status: AC | PRN
  Administered 2021-06-19: 10 mL via INTRAVENOUS

## 2021-06-19 NOTE — ED Provider Notes (Signed)
Patient's MRI was reviewed by Dr. Selina Cooley on call from neurology.  And there was nothing definite for multiple sclerosis.  But she recommended follow-up with neurology.  Patient has not had anything new in the last 24 hours all symptoms sort of come and go and they have been present now for months.  Patient last seen by wellness clinic in November.  We will have patient follow back up with wellness clinic.  We will give patient referral to [facility] neurology. ?  ?Vanetta Mulders, MD ?06/19/21 1056 ? ?

## 2021-06-19 NOTE — ED Provider Notes (Signed)
? ?[facility]  ?Provider Note ? ?CSN: [id number] ?Arrival date & time: 06/18/21 1743 ? ?History ?Chief Complaint  ?Patient presents with  ? Numbness  ? ?History obtained with daughter at bedside helping translate. Offered video interpreter but she declines ? ?Christie Holder is a 51 y.o. female complaining of several months of tingling in her hands and feet that has gotten progressively worse, associated with cramping and pain. She has had some 'drooling', trouble speaking at times. She was seen at [facility] in November and had negative lab evaluation for paresthesias. Has not been back to the doctor since then.  ? ? ?Home Medications ?Prior to Admission medications   ?Medication Sig Start Date End Date Taking? Authorizing Provider  ?gabapentin (NEURONTIN) 300 MG capsule Take 1 capsule (300 mg total) by mouth at bedtime. 02/21/21 05/22/21  Gildardo Pounds, NP  ?ibuprofen (ADVIL,MOTRIN) 600 MG tablet Take 1 tablet (600 mg total) by mouth every 6 (six) hours as needed for cramping. 07/16/13   Tamala Julian, [place], [place]  ?Multiple Vitamin (MULTIVITAMIN) tablet Take 1 tablet by mouth daily.    [provider]  ?Multiple Vitamins-Minerals (HAIR SKIN AND NAILS FORMULA PO) Take 1 tablet by mouth daily.    [provider]  ? ? ? ?Allergies    ?Patient has no known allergies. ? ? ?Review of Systems   ?Review of Systems ?Please see HPI for pertinent positives and negatives ? ?Physical Exam ?BP 114/73 (BP Location: Left Arm)   Pulse (!) 51   Temp 98.4 ?F (36.9 ?C) (Oral)   Resp 16   SpO2 96%  ? ?Physical Exam ?Vitals and nursing note reviewed.  ?Constitutional:   ?   Appearance: Normal appearance.  ?HENT:  ?   Head: Normocephalic and atraumatic.  ?   Nose: Nose normal.  ?   Mouth/Throat:  ?   Mouth: Mucous membranes are moist.  ?Eyes:  ?   Extraocular Movements: Extraocular movements intact.  ?   Conjunctiva/sclera: Conjunctivae normal.  ?Cardiovascular:  ?   Rate and Rhythm:  Normal rate.  ?Pulmonary:  ?   Effort: Pulmonary effort is normal.  ?   Breath sounds: Normal breath sounds.  ?Abdominal:  ?   General: Abdomen is flat.  ?   Palpations: Abdomen is soft.  ?   Tenderness: There is no abdominal tenderness.  ?Musculoskeletal:     ?   General: No swelling. Normal range of motion.  ?   Cervical back: Neck supple.  ?Skin: ?   General: Skin is warm and dry.  ?Neurological:  ?   General: No focal deficit present.  ?   Mental Status: She is alert.  ?   Cranial Nerves: No cranial nerve deficit.  ?   Sensory: Sensory deficit (subjective decreased sensations of hands and feet) present.  ?   Motor: No weakness.  ?   Gait: Gait normal.  ?Psychiatric:     ?   Mood and Affect: Mood normal.  ? ? ?ED Results / Procedures / Treatments   ?EKG ?EKG Interpretation ? ?Date/Time:  Monday June 18 2021 18:03:08 EDT ?Ventricular Rate:  72 ?PR Interval:  154 ?QRS Duration: 78 ?QT Interval:  406 ?QTC Calculation: 444 ?R Axis:   68 ?Text Interpretation: Normal sinus rhythm Nonspecific ST abnormality Abnormal ECG No significant change since last tracing Confirmed by Calvert Cantor [phone]) on 06/19/2021 6:15:03 AM ? ?Procedures ?Procedures ? ?Medications Ordered in the ED ?Medications - No data to  display ? ?Initial Impression and Plan ? Patient here with progressive paresthesias among other vague neurologic symptoms. She does not have any focal deficit on exam. Labs and imaging done in triage showed normal CBC, CMP with mild hypokalemia, coags normal. I personally viewed the images from radiology studies and agree with radiologist interpretation: CT head without acute hemorrhage but there is some white matter changes concerning for MS. Will send for MRI to fully evaluate. Patient and daughter aware of plan. Care will be signed out to the oncoming team at the change of shift.  ? ? ?ED Course  ? ?  ? ? ?MDM Rules/Calculators/A&P ?Medical Decision Making ?Amount and/or Complexity of Data Reviewed ?Labs: ordered.  Decision-making details documented in ED Course. ?Radiology: ordered and independent interpretation performed. Decision-making details documented in ED Course. ?ECG/medicine tests: ordered and independent interpretation performed. Decision-making details documented in ED Course. ? ? ? ?Final Clinical Impression(s) / ED Diagnoses ?Final diagnoses:  ?Paresthesia  ? ? ?Rx / DC Orders ?ED Discharge Orders   ? ? None  ? ?  ? ?  ?Truddie Hidden, MD ?06/19/21 [phone] ? ?

## 2021-06-19 NOTE — Discharge Instructions (Signed)
Follow back up with the wellness clinic.  Also follow-up with [facility] neurology.  Call and make an appointment.  MRI done today was equivocal.  But the on-call neurologist recommended follow-up with neurology as an outpatient. ?

## 2021-10-21 NOTE — Progress Notes (Deleted)
   NEUROLOGY CONSULTATION NOTE  Sibel Khurana MRN: [id number] DOB: Feb 05, 1971  Referring provider: Vanetta Mulders, MD (ED referral) Primary care provider: Bertram Denver, NP  Reason for consult:  paresthesias  Assessment/Plan:   ***   Subjective:  Christie Holder is a 51 year old female who presents for paresthesias.  History supplemented by ED note.  ***.  CT and MRI of brain personally reviewed.  Since last year, she has been experiencing numbness and tingling in her hands and feet.  Symptoms have progressively gotten worse.  She notes associated cramping and pain in ***.  B12 and TSH from November 2022 were unremarkable.  Sometimes she may start drooling or have trouble speaking.  She was seen in the ED on 06/18/2021 for further evaluation.  CT head showed no acute findings but demonstrated patchy subcortical white matter hypodensities bilaterally.  Follow up MRI of brain with and without contrast showed nonspecific hyperintense T2/FLAIR foci in the bilateral cerebral white matter but no abnormal enhancement.  CBC, CMP and coags overall unremarkable.       PAST MEDICAL HISTORY: Past Medical History:  Diagnosis Date   Gestational diabetes 10/10/2011    PAST SURGICAL HISTORY: Past Surgical History:  Procedure Laterality Date   CESAREAN SECTION  12/19/2011   Procedure: CESAREAN SECTION;  Surgeon: Willodean Rosenthal, MD;  Location: [facility] ORS;  Service: Obstetrics;  Laterality: N/A;   CHOLECYSTECTOMY N/A 10/01/2016   Procedure: LAPAROSCOPIC CHOLECYSTECTOMY;  Surgeon: Violeta Gelinas, MD;  Location: [facility] OR;  Service: General;  Laterality: N/A;    MEDICATIONS: Current Outpatient Medications on File Prior to Visit  Medication Sig Dispense Refill   acetaminophen (TYLENOL) 500 MG tablet Take 1,000 mg by mouth every 6 (six) hours as needed for mild pain.     ibuprofen (ADVIL) 200 MG tablet Take 400 mg by mouth every 6 (six) hours as needed for mild pain.     No current  facility-administered medications on file prior to visit.    ALLERGIES: No Known Allergies  FAMILY HISTORY: Family History  Problem Relation Age of Onset   Diabetes Maternal Grandmother     Objective:  *** General: No acute distress.  Patient appears well-groomed.   Head:  Normocephalic/atraumatic Eyes:  fundi examined but not visualized Neck: supple, no paraspinal tenderness, full range of motion Back: No paraspinal tenderness Heart: regular rate and rhythm Lungs: Clear to auscultation bilaterally. Vascular: No carotid bruits. Neurological Exam: Mental status: alert and oriented to person, place, and time, speech fluent and not dysarthric, language intact. Cranial nerves: CN I: not tested CN II: pupils equal, round and reactive to light, visual fields intact CN III, IV, VI:  full range of motion, no nystagmus, no ptosis CN V: facial sensation intact. CN VII: upper and lower face symmetric CN VIII: hearing intact CN IX, X: gag intact, uvula midline CN XI: sternocleidomastoid and trapezius muscles intact CN XII: tongue midline Bulk & Tone: normal, no fasciculations. Motor:  muscle strength 5/5 throughout Sensation:  Pinprick, temperature and vibratory sensation intact. Deep Tendon Reflexes:  2+ throughout,  toes downgoing.   Finger to nose testing:  Without dysmetria.   Heel to shin:  Without dysmetria.   Gait:  Normal station and stride.  Romberg negative.    Thank you for allowing me to take part in the care of this patient.  Shon Millet, DO  CC: Bertram Denver, NP

## 2021-10-22 ENCOUNTER — Encounter: Payer: Self-pay | Admitting: Neurology

## 2021-10-22 ENCOUNTER — Ambulatory Visit: Payer: Managed Care, Other (non HMO) | Admitting: Neurology

## 2021-10-22 DIAGNOSIS — Z029 Encounter for administrative examinations, unspecified: Secondary | ICD-10-CM

## 2023-09-03 ENCOUNTER — Emergency Department (HOSPITAL_COMMUNITY): Payer: Self-pay

## 2023-09-03 ENCOUNTER — Encounter (HOSPITAL_COMMUNITY): Payer: Self-pay

## 2023-09-03 ENCOUNTER — Other Ambulatory Visit: Payer: Self-pay

## 2023-09-03 ENCOUNTER — Emergency Department (HOSPITAL_COMMUNITY)
Admission: EM | Admit: 2023-09-03 | Discharge: 2023-09-03 | Disposition: A | Discharge status: Home / Self Care | Payer: Self-pay | Attending: Emergency Medicine | Admitting: Emergency Medicine

## 2023-09-03 DIAGNOSIS — R55 Syncope and collapse: Secondary | ICD-10-CM | POA: Insufficient documentation

## 2023-09-03 LAB — DIFFERENTIAL
Abs Immature Granulocytes: 0.06 10*3/uL (ref 0.00–0.07)
Basophils Absolute: 0 10*3/uL (ref 0.0–0.1)
Basophils Relative: 1 %
Eosinophils Absolute: 0.1 10*3/uL (ref 0.0–0.5)
Eosinophils Relative: 2 %
Immature Granulocytes: 1 %
Lymphocytes Relative: 31 %
Lymphs Abs: 2.7 10*3/uL (ref 0.7–4.0)
Monocytes Absolute: 0.5 10*3/uL (ref 0.1–1.0)
Monocytes Relative: 5 %
Neutro Abs: 5.4 10*3/uL (ref 1.7–7.7)
Neutrophils Relative %: 60 %

## 2023-09-03 LAB — CBC
HCT: 36.3 % (ref 36.0–46.0)
Hemoglobin: 12.4 g/dL (ref 12.0–15.0)
MCH: 32 pg (ref 26.0–34.0)
MCHC: 34.2 g/dL (ref 30.0–36.0)
MCV: 93.6 fL (ref 80.0–100.0)
Platelets: 261 10*3/uL (ref 150–400)
RBC: 3.88 MIL/uL (ref 3.87–5.11)
RDW: 13.2 % (ref 11.5–15.5)
WBC: 8.8 10*3/uL (ref 4.0–10.5)
nRBC: 0 % (ref 0.0–0.2)

## 2023-09-03 LAB — I-STAT CHEM 8, ED
BUN: 7 mg/dL (ref 6–20)
Calcium, Ion: 1.16 mmol/L (ref 1.15–1.40)
Chloride: 101 mmol/L (ref 98–111)
Creatinine, Ser: 0.6 mg/dL (ref 0.44–1.00)
Glucose, Bld: 140 mg/dL — ABNORMAL HIGH (ref 70–99)
HCT: 37 % (ref 36.0–46.0)
Hemoglobin: 12.6 g/dL (ref 12.0–15.0)
Potassium: 3.8 mmol/L (ref 3.5–5.1)
Sodium: 138 mmol/L (ref 135–145)
TCO2: 23 mmol/L (ref 22–32)

## 2023-09-03 LAB — RAPID URINE DRUG SCREEN, HOSP PERFORMED
Amphetamines: NOT DETECTED
Barbiturates: NOT DETECTED
Benzodiazepines: NOT DETECTED
Cocaine: NOT DETECTED
Opiates: NOT DETECTED
Tetrahydrocannabinol: NOT DETECTED

## 2023-09-03 LAB — COMPREHENSIVE METABOLIC PANEL WITH GFR
ALT: 36 U/L (ref 0–44)
AST: 43 U/L — ABNORMAL HIGH (ref 15–41)
Albumin: 3.7 g/dL (ref 3.5–5.0)
Alkaline Phosphatase: 83 U/L (ref 38–126)
Anion gap: 10 (ref 5–15)
BUN: 6 mg/dL (ref 6–20)
CO2: 24 mmol/L (ref 22–32)
Calcium: 9 mg/dL (ref 8.9–10.3)
Chloride: 103 mmol/L (ref 98–111)
Creatinine, Ser: 0.6 mg/dL (ref 0.44–1.00)
GFR, Estimated: >60 mL/min (ref >60)
Glucose, Bld: 142 mg/dL — ABNORMAL HIGH (ref 70–99)
Potassium: 3.8 mmol/L (ref 3.5–5.1)
Sodium: 137 mmol/L (ref 135–145)
Total Bilirubin: 0.9 mg/dL (ref 0.0–1.2)
Total Protein: 7.4 g/dL (ref 6.5–8.1)

## 2023-09-03 LAB — ETHANOL: Alcohol, Ethyl (B): <15 mg/dL (ref <15)

## 2023-09-03 LAB — PROTIME-INR
INR: 1 (ref 0.8–1.2)
Prothrombin Time: 13.1 s (ref 11.4–15.2)

## 2023-09-03 LAB — HCG, SERUM, QUALITATIVE: Preg, Serum: NEGATIVE

## 2023-09-03 LAB — APTT: aPTT: 27 s (ref 24–36)

## 2023-09-03 MED ORDER — SODIUM CHLORIDE 0.9 % IV BOLUS
1000.0000 mL | Freq: Once | INTRAVENOUS | Status: AC
  Administered 2023-09-03: 1000 mL via INTRAVENOUS

## 2023-09-03 NOTE — ED Triage Notes (Signed)
 Pt states she was at [employer] and passed out. Denies hitting head. No blood thinners. C/O right arm and right leg pain. C/O blurred vision/ nausea/ headaches. Sx started at  0900.

## 2023-09-03 NOTE — ED Provider Triage Note (Signed)
 Emergency Medicine Provider Triage Evaluation Note  Christie Holder , a 53 y.o. female  was evaluated in triage.  Pt complains of syncope.  States she passed [employer] today.  She also reports that the right side of her body was numb that started at 9 AM.  Her last known normal was 3 AM this morning when she went to bed.  She stated that she was feeling nauseous and lightheaded before she passed out.  Denies hitting her head.  Did report some intermittent blurry vision bilaterally and headaches.  Denies chest pain or shortness of breath.  Review of Systems  Positive: See above Negative: See above  Physical Exam  BP 123/69 (BP Location: Left Arm)   Pulse 69   Temp (!) 97.5 F (36.4 C)   Resp 12   Ht 5' 6 (1.676 m)   Wt 99.8 kg   SpO2 98%   BMI 35.51 kg/m  Gen:   Awake, no distress   Resp:  Normal effort  MSK:   Moves extremities without difficulty  Other:    Medical Decision Making  Medically screening exam initiated at 2:27 PM.  Appropriate orders placed.  Christie Holder was informed that the remainder of the evaluation will be completed by another provider, this initial triage assessment does not replace that evaluation, and the importance of remaining in the ED until their evaluation is complete.  Work up started   Christie Mcmurray, PA-C 09/03/23 1428

## 2023-09-03 NOTE — Discharge Instructions (Signed)
 You were seen in the emerged department after an episode of fainting Your blood work EKG and CAT scan of your head all looked okay This may have been due to dehydration or heat exhaustion We gave you IV fluids and he felt better Follow-up with your primary care doctor within 1 week for reevaluation Return to the emergency department for repeated fainting or any other concerns Keep well-hydrated at home especially when exercising

## 2023-09-03 NOTE — ED Provider Notes (Signed)
 [place] [facility] Provider Note   CSN: [id number] Arrival date & time: 09/03/23  1344     History  Chief Complaint  Patient presents with   Loss of Consciousness    Christie Holder is a 53 y.o. female.  With a history of obesity who presents to the ED after syncopal episode.  Patient attended Zumba class earlier this morning and noted that the studio did not have air conditioning or fans and was much hotter than usual.  Shortly thereafter she went to [facility] where she had episode of lightheadedness and syncopal episode.  No head trauma or associated injuries.  She has felt lightheaded since then.  No headaches, change in vision, nausea vomiting chest pain or shortness of breath.  No prior history of syncopal episodes before today's   Loss of Consciousness      Home Medications Prior to Admission medications   Medication Sig Start Date End Date Taking? Authorizing Provider  acetaminophen  (TYLENOL ) 500 MG tablet Take 1,000 mg by mouth every 6 (six) hours as needed for mild pain.    [provider]  ibuprofen  (ADVIL ) 200 MG tablet Take 400 mg by mouth every 6 (six) hours as needed for mild pain.    [provider]      Allergies    Patient has no known allergies.    Review of Systems   Review of Systems  Cardiovascular:  Positive for syncope.    Physical Exam Updated Vital Signs BP (!) 136/127   Pulse 60   Temp 98.3 F (36.8 C) (Oral)   Resp 18   Ht 5' 6 (1.676 m)   Wt 99.8 kg   SpO2 100%   BMI 35.51 kg/m  Physical Exam Vitals and nursing note reviewed.  HENT:     Head: Normocephalic and atraumatic.  Eyes:     Pupils: Pupils are equal, round, and reactive to light.  Cardiovascular:     Rate and Rhythm: Normal rate and regular rhythm.  Pulmonary:     Effort: Pulmonary effort is normal.     Breath sounds: Normal breath sounds.  Abdominal:     Palpations: Abdomen is soft.     Tenderness: There is no  abdominal tenderness.  Skin:    General: Skin is warm and dry.  Neurological:     General: No focal deficit present.     Mental Status: She is alert.     Sensory: No sensory deficit.     Motor: No weakness.  Psychiatric:        Mood and Affect: Mood normal.     ED Results / Procedures / Treatments   Labs (all labs ordered are listed, but only abnormal results are displayed) Labs Reviewed  COMPREHENSIVE METABOLIC PANEL WITH GFR - Abnormal; Notable for the following components:      Result Value   Glucose, Bld 142 (*)    AST 43 (*)    All other components within normal limits  I-STAT CHEM 8, ED - Abnormal; Notable for the following components:   Glucose, Bld 140 (*)    All other components within normal limits  ETHANOL  PROTIME-INR  APTT  CBC  DIFFERENTIAL  RAPID URINE DRUG SCREEN, HOSP PERFORMED  HCG, SERUM, QUALITATIVE    EKG EKG Interpretation Date/Time:  Wednesday September 03 2023 14:38:33 EDT Ventricular Rate:  65 PR Interval:  152 QRS Duration:  86 QT Interval:  430 QTC Calculation: 447 R Axis:  33  Text Interpretation: Normal sinus rhythm Cannot rule out Anterior infarct , age undetermined Abnormal ECG When compared with ECG of 18-Jun-2021 18:03, PREVIOUS ECG IS PRESENT Confirmed by Rafael Bun [phone]) on 09/03/2023 7:42:00 PM  Radiology CT HEAD WO CONTRAST Result Date: 09/03/2023 CLINICAL DATA:  Neuro deficit. EXAM: CT HEAD WITHOUT CONTRAST TECHNIQUE: Contiguous axial images were obtained from the base of the skull through the vertex without intravenous contrast. RADIATION DOSE REDUCTION: This exam was performed according to the departmental dose-optimization program which includes automated exposure control, adjustment of the [place] and/or kV according to patient size and/or use of iterative reconstruction technique. COMPARISON:  June 18, 2021 FINDINGS: Brain: No evidence of acute infarction, hemorrhage, hydrocephalus, extra-axial collection or mass lesion/mass  effect. Vascular: No hyperdense vessel or unexpected calcification. Skull: Normal. Negative for fracture or focal lesion. Sinuses/Orbits: No acute finding. Other: None. IMPRESSION: No acute intracranial pathology. Electronically Signed   By: Christie Holder M.D.   On: 09/03/2023 16:19    Procedures Procedures    Medications Ordered in ED Medications  sodium chloride  0.9 % bolus 1,000 mL (1,000 mLs Intravenous New Bag/Given 09/03/23 2149)    ED Course/ Medical Decision Making/ A&P                                 Medical Decision Making 53 year old female with history as above presenting after syncopal episode.  This occurred at [facility] after a strenuous Zumba class in a hot studio.  Still feels little lightheaded but otherwise feels well now.  CT head unremarkable.  No significant electrolyte or other abnormalities on metabolic panel.  Renal function at baseline.  CBC shows no evidence of severe anemia.  Pregnancy negative.  EKG without evidence of ischemia or dysrhythmia.  Patient well-appearing hemodynamically stable.  I did give fluids think this may have been due to heat exhaustion or dehydration.  She will follow-up with a primary care doctor           Final Clinical Impression(s) / ED Diagnoses Final diagnoses:  Syncope, unspecified syncope type    Rx / DC Orders ED Discharge Orders     None         Christie Creamer, DO 09/03/23 2313
